# Patient Record
Sex: Male | Born: 1955 | Race: White | Hispanic: No | Smoking: Never smoker
Health system: Southern US, Community
[De-identification: ages and names within clinical notes are randomized; demographics above are authoritative.]

## PROBLEM LIST (undated history)

## (undated) DIAGNOSIS — C801 Malignant (primary) neoplasm, unspecified: Secondary | ICD-10-CM

## (undated) DIAGNOSIS — R51 Headache: Secondary | ICD-10-CM

## (undated) DIAGNOSIS — K219 Gastro-esophageal reflux disease without esophagitis: Secondary | ICD-10-CM

## (undated) DIAGNOSIS — Z9889 Other specified postprocedural states: Secondary | ICD-10-CM

## (undated) DIAGNOSIS — R112 Nausea with vomiting, unspecified: Secondary | ICD-10-CM

## (undated) DIAGNOSIS — I2510 Atherosclerotic heart disease of native coronary artery without angina pectoris: Secondary | ICD-10-CM

## (undated) DIAGNOSIS — G4733 Obstructive sleep apnea (adult) (pediatric): Secondary | ICD-10-CM

## (undated) DIAGNOSIS — R0683 Snoring: Secondary | ICD-10-CM

## (undated) DIAGNOSIS — M1990 Unspecified osteoarthritis, unspecified site: Secondary | ICD-10-CM

## (undated) HISTORY — DX: Snoring: R06.83

## (undated) HISTORY — PX: SKIN CANCER EXCISION: SHX779

## (undated) HISTORY — DX: Obstructive sleep apnea (adult) (pediatric): G47.33

## (undated) HISTORY — PX: HIP ARTHROSCOPY: SUR88

## (undated) HISTORY — PX: HERNIA REPAIR: SHX51

## (undated) HISTORY — PX: TONSILLECTOMY: SUR1361

## (undated) HISTORY — PX: EYE SURGERY: SHX253

---

## 1993-04-10 HISTORY — PX: SKIN CANCER EXCISION: SHX779

## 1997-09-03 ENCOUNTER — Ambulatory Visit (HOSPITAL_COMMUNITY): Admission: RE | Admit: 1997-09-03 | Discharge: 1997-09-03 | Payer: Self-pay | Admitting: Orthopedic Surgery

## 1998-08-03 ENCOUNTER — Ambulatory Visit (HOSPITAL_COMMUNITY): Admission: RE | Admit: 1998-08-03 | Discharge: 1998-08-03 | Payer: Self-pay | Admitting: Orthopedic Surgery

## 1998-08-03 ENCOUNTER — Encounter: Payer: Self-pay | Admitting: Orthopedic Surgery

## 1998-11-11 ENCOUNTER — Encounter: Payer: Self-pay | Admitting: Orthopedic Surgery

## 1998-11-15 ENCOUNTER — Encounter: Payer: Self-pay | Admitting: Orthopedic Surgery

## 1998-11-15 ENCOUNTER — Inpatient Hospital Stay (HOSPITAL_COMMUNITY): Admission: RE | Admit: 1998-11-15 | Discharge: 1998-11-18 | Payer: Self-pay | Admitting: Orthopedic Surgery

## 1998-12-06 ENCOUNTER — Ambulatory Visit (HOSPITAL_COMMUNITY): Admission: RE | Admit: 1998-12-06 | Discharge: 1998-12-06 | Payer: Self-pay | Admitting: Orthopedic Surgery

## 1999-04-11 HISTORY — PX: OTHER SURGICAL HISTORY: SHX169

## 1999-09-27 ENCOUNTER — Encounter: Payer: Self-pay | Admitting: Emergency Medicine

## 1999-09-27 ENCOUNTER — Emergency Department (HOSPITAL_COMMUNITY): Admission: EM | Admit: 1999-09-27 | Discharge: 1999-09-27 | Payer: Self-pay | Admitting: Emergency Medicine

## 2000-04-10 HISTORY — PX: HERNIA REPAIR: SHX51

## 2000-06-21 ENCOUNTER — Ambulatory Visit (HOSPITAL_COMMUNITY): Admission: RE | Admit: 2000-06-21 | Discharge: 2000-06-21 | Payer: Self-pay | Admitting: Orthopedic Surgery

## 2000-06-21 ENCOUNTER — Encounter: Payer: Self-pay | Admitting: Orthopedic Surgery

## 2000-07-30 ENCOUNTER — Ambulatory Visit (HOSPITAL_COMMUNITY): Admission: RE | Admit: 2000-07-30 | Discharge: 2000-07-30 | Payer: Self-pay | Admitting: Surgery

## 2004-04-26 ENCOUNTER — Ambulatory Visit (HOSPITAL_COMMUNITY): Admission: RE | Admit: 2004-04-26 | Discharge: 2004-04-26 | Payer: Self-pay | Admitting: Ophthalmology

## 2005-04-10 HISTORY — PX: RETINAL DETACHMENT SURGERY: SHX105

## 2009-12-22 ENCOUNTER — Ambulatory Visit (HOSPITAL_BASED_OUTPATIENT_CLINIC_OR_DEPARTMENT_OTHER): Admission: RE | Admit: 2009-12-22 | Discharge: 2009-12-22 | Payer: Self-pay | Admitting: Family Medicine

## 2009-12-26 ENCOUNTER — Ambulatory Visit: Payer: Self-pay | Admitting: Internal Medicine

## 2010-01-07 ENCOUNTER — Ambulatory Visit: Payer: Self-pay | Admitting: Internal Medicine

## 2010-01-07 DIAGNOSIS — J3089 Other allergic rhinitis: Secondary | ICD-10-CM

## 2010-01-07 DIAGNOSIS — J302 Other seasonal allergic rhinitis: Secondary | ICD-10-CM | POA: Insufficient documentation

## 2010-01-07 DIAGNOSIS — G4733 Obstructive sleep apnea (adult) (pediatric): Secondary | ICD-10-CM | POA: Insufficient documentation

## 2010-01-07 DIAGNOSIS — J45909 Unspecified asthma, uncomplicated: Secondary | ICD-10-CM | POA: Insufficient documentation

## 2010-02-08 ENCOUNTER — Ambulatory Visit (HOSPITAL_BASED_OUTPATIENT_CLINIC_OR_DEPARTMENT_OTHER): Admission: RE | Admit: 2010-02-08 | Discharge: 2010-02-08 | Payer: Self-pay | Admitting: Internal Medicine

## 2010-02-08 ENCOUNTER — Encounter: Payer: Self-pay | Admitting: Internal Medicine

## 2010-03-25 ENCOUNTER — Ambulatory Visit: Payer: Self-pay | Admitting: Internal Medicine

## 2010-03-31 ENCOUNTER — Encounter: Payer: Self-pay | Admitting: Internal Medicine

## 2010-04-23 ENCOUNTER — Encounter: Payer: Self-pay | Admitting: Internal Medicine

## 2010-04-26 ENCOUNTER — Ambulatory Visit
Admission: RE | Admit: 2010-04-26 | Discharge: 2010-04-26 | Payer: Self-pay | Source: Home / Self Care | Attending: Internal Medicine | Admitting: Internal Medicine

## 2010-05-03 ENCOUNTER — Encounter: Payer: Self-pay | Admitting: Internal Medicine

## 2010-05-08 ENCOUNTER — Telehealth: Payer: Self-pay | Admitting: Internal Medicine

## 2010-05-12 NOTE — Assessment & Plan Note (Signed)
Summary: James Mack   Copy to:  Dr. Bradd Canary Primary Provider/Referring Provider:  Dr. Catha Gosselin  CC:  follow up visit-review sleep study.  History of Present Illness:  History of Present Illness: January 07, 2010- 51 yoM with hx loud snoring and daytime sleepiness, referred by Dr Artis Flock. An initial sleep study was interrupted when tech woke him to adjust a wire and he couldn't regain sleep. Says his snorinig drives his girlfriend out of the house. No hx ENT surgery except tonsils. Nasal strips didn't help. He sleeps on side. Feels tired after lunch. Cutting back caffeine to 2 daily.  Bedtime 12- 1 AM, sleep latency 15 minutes, up at 6AM, not aware of waking during the night.  NPSG 12/22/09 recorded only 97 minutes of sleep and wasn't sufficient for confident interpretation, but did record some apnea events.  March 25, 2010- OSA Nurse-CC: follow up visit-review sleep study  Mild OSA AHI 6.2/ hr. Disruptive snoring and difficulty maintaining sleep have been complaints. He failed trials of chin straps, is sleeping on side and not overweight.  He is currently on cough syrup, mucinex and cefdinir for a cold.     Asthma History    Initial Asthma Severity Rating:    Age range: 12+ years    Symptoms: 0-2 days/week    Nighttime Awakenings: 0-2/month    Interferes w/ normal activity: no limitations    SABA use (not for EIB): 0-2 days/week    Asthma Severity Assessment: Intermittent   Preventive Screening-Counseling & Management  Alcohol-Tobacco     Smoking Status: never  Current Medications (verified): 1)  None  Allergies (verified): No Known Drug Allergies  Past History:  Past Medical History: Hx of ASTHMA, CHILDHOOD (ICD-493.00) ALLERGIC RHINITIS Snoring Obstructive sleep apnea with insomnia- mild- NPSG 02/08/10- AHI 6.2/hr  Review of Systems      See HPI  The patient denies shortness of breath with activity, shortness of breath at rest, productive cough,  non-productive cough, coughing up blood, chest pain, irregular heartbeats, acid heartburn, indigestion, loss of appetite, weight change, abdominal pain, difficulty swallowing, sore throat, tooth/dental problems, headaches, nasal congestion/difficulty breathing through nose, and sneezing.    Vital Signs:  Patient profile:   55 year old male Height:      73 inches Weight:      241.50 pounds BMI:     31.98 O2 Sat:      96 % on Room air Pulse rate:   75 / minute BP sitting:   124 / 80  (left arm) Cuff size:   regular  Vitals Entered By: Reynaldo Minium CMA (March 25, 2010 10:04 AM)  O2 Flow:  Room air CC: follow up visit-review sleep study   Physical Exam  Additional Exam:  General: A/Ox3; pleasant and cooperative, NAD, medium build, calm and appropriate SKIN: no rash, lesions NODES: no lymphadenopathy HEENT: /AT, EOM- WNL, Conjuctivae- clear, PERRLA, TM-WNL, Nose- clear, Throat- clear and wnl, Mallampati  II, dental repair NECK: Supple w/ fair ROM, JVD- none, normal carotid impulses w/o bruits Thyroid- normal to palpation CHEST: Clear to P&A HEART: RRR, no m/g/r heard ABDOMEN: Soft and nl; nml bowel sounds; no organomegaly or masses noted AOZ:HYQM, nl pulses, no edema  NEURO: Grossly intact to observation      Impression & Recommendations:  Problem # 1:  OBSTRUCTIVE SLEEP APNEA (ICD-327.23) We discussed treatment for sleep apnea vs simple snoing, and the medical issues, management of insomnia, and sleep hygiene. We reviewed available treatments carefully. He is  interested in trying CPAP, having failed more conservative measures.  Problem # 2:  Hx of ASTHMA, CHILDHOOD (ICD-493.00) Incidental cold now, has not triggered wheezing.  Other Orders: Est. Patient Level III (16109) DME Referral (DME)  Patient Instructions: 1)  Please schedule a follow-up appointment in 1 month. 2)  See [facility] to set up a trial of CPAP.

## 2010-05-12 NOTE — Assessment & Plan Note (Signed)
Summary: insomnia///kp   Visit Type:  Initial Consult Copy to:  Dr. Bradd Canary Primary Provider/Referring Provider:  Dr. Catha Gosselin  CC:  Sleep Consultation.  History of Present Illness: January 07, 2010- 51 yoM with hx loud snoring and daytime sleepiness, referred by Dr Artis Flock. An initial sleep study was interrupted when tech woke him to adjust a wire and he couldn't regain sleep. Says his snorinig drives his girlfriend out of the house. No hx ENT surgery except tonsils. Nasal strips didn't help. He sleeps on side. Feels tired after lunch. Cutting back caffeine to 2 daily.  Bedtime 12- 1 AM, sleep latency 15 minutes, up at 6AM, not aware of waking during the night.  NPSG 12/22/09 recorded only 97 minutes of sleep and wasn't sufficient for confident interpretation, but did record some apnea events.  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    Vital Signs:  Patient profile:   55 year old male Height:      73 inches Weight:      245 pounds BMI:     32.44 O2 Sat:      99 % on Room air Pulse rate:   59 / minute BP sitting:   126 / 74  (left arm)  Vitals Entered By: Vernie Murders (January 07, 2010 10:31 AM)  O2 Flow:  Room air  Physical Exam  Additional Exam:  General: A/Ox3; pleasant and cooperative, NAD, medium build, calm and appropriate  SKIN: no rash, lesions NODES: no lymphadenopathy HEENT: [place]/AT, EOM- WNL, Conjuctivae- clear, PERRLA, TM-WNL, Nose- clear, Throat- clear and wnl, Mallampati  II, dental repair NECK: Supple w/ fair ROM, JVD- none, normal carotid impulses w/o bruits Thyroid- normal to palpation CHEST: Clear to P&A HEART: RRR, no m/g/r heard ABDOMEN: Soft and nl; nml bowel sounds; no organomegaly or masses noted ZOX:WRUE, nl pulses, no edema  NEURO: Grossly intact to observation      Impression & Recommendations:  Problem # 1:  OBSTRUCTIVE SLEEP APNEA (ICD-327.23)  I don't think the original sleep study was representative. We will try again, giving an ambien sample. I am suspicious that he is more than just a problem snorer and probably has some degree of sleep apnea.  We have begun discussion of sleep hygiene, sleep apnea, and diagnostic  criteria.  Other Orders: Consultation Level IV (16109) Sleep Disorder Referral (Sleep Disorder)  Patient Instructions: 1)  Please schedule a follow-up appointment in 1 month. 2)  See [facility] to schedlue  Sleep study 3)  Sample Ambien 10 mg 4)  Consider  chin strap for snoring 5)  Consider a boil and bite type otc mouth piece

## 2010-05-12 NOTE — Assessment & Plan Note (Signed)
Summary: rov 1 month ///kp   Copy to:  Dr. Bradd Canary Primary Provider/Referring Provider:  Dr. Catha Gosselin  CC:  1 month follow up visit-OSA.  History of Present Illness: History of Present Illness: January 07, 2010- 51 yoM with hx loud snoring and daytime sleepiness, referred by Dr Artis Flock. An initial sleep study was interrupted when tech woke him to adjust a wire and he couldn't regain sleep. Says his snorinig drives his girlfriend out of the house. No hx ENT surgery except tonsils. Nasal strips didn't help. He sleeps on side. Feels tired after lunch. Cutting back caffeine to 2 daily.  Bedtime 12- 1 AM, sleep latency 15 minutes, up at 6AM, not aware of waking during the night.  NPSG 12/22/09 recorded only 97 minutes of sleep and wasn't sufficient for confident interpretation, but did record some apnea events.  March 25, 2010- OSA Nurse-CC: follow up visit-review sleep study  Mild OSA AHI 6.2/ hr. Disruptive snoring and difficulty maintaining sleep have been complaints. He failed trials of chin straps, is sleeping on side and not overweight.  He is currently on cough syrup, mucinex and cefdinir for a cold.  April 26, 2010-  OSA Nurse-CC: 1 month follow up visit-OSA He has not yet turned in his download chip but has continued AUTOPAP after starting last visit.  He resolved cold/ broncitis problem from last visit.  His girlfriend reports no snoring on PAP. He already feels he would miss it if sleeping without it.      Preventive Screening-Counseling & Management  Alcohol-Tobacco     Smoking Status: never  Current Medications (verified): 1)  Cpap 4-15 .... [facility]  Allergies (verified): No Known Drug Allergies  Past History:  Past Medical History: Last updated: 03/25/2010 Hx of ASTHMA, CHILDHOOD (ICD-493.00) ALLERGIC RHINITIS Snoring Obstructive sleep apnea with insomnia- mild- NPSG 02/08/10- AHI 6.2/hr  Past Surgical History: Last updated: 01/07/2010 Rt Hip replaced  2001.........................................Marland KitchenNeed to verify this???????? 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    Vital Signs:  Patient profile:   55 year old male Height:      73 inches Weight:      247.38 pounds BMI:     32.76 O2 Sat:      97 % on Room air Pulse rate:   85 / minute BP sitting:   118 / 78  (left arm) Cuff size:   regular  Vitals Entered By: Reynaldo Minium CMA (April 26, 2010 1:53 PM)  O2 Flow:  Room air CC: 1 month follow up visit-OSA   Physical Exam  Additional Exam:  General: A/Ox3; pleasant and cooperative, NAD, medium build, calm and appropriate SKIN: no rash, lesions NODES: no lymphadenopathy HEENT: [place]/AT, EOM- WNL, Conjuctivae- clear, PERRLA, TM-WNL, Nose- clear, Throat- clear and wnl, Mallampati  II, dental repair NECK: Supple w/ fair ROM, JVD- none, normal carotid impulses w/o bruits Thyroid- normal to palpation CHEST: Clear to  P&A HEART: RRR, no m/g/r heard ABDOMEN: a little overweight ZOX:WRUE, nl pulses, no edema  NEURO: Grossly intact to observation      Impression & Recommendations:  Problem # 1:  OBSTRUCTIVE SLEEP APNEA (ICD-327.23)  Good initial start on CPAP with autoPAP. He will check with DME on  turning in download,  but is comfortable pending that.  We again discussed treatment options and cpap comfort.  Problem # 2:  ALLERGY (ICD-995.3)  Anticipates some spring time nasal congestion, usually not bad enough to affect CPAP use.   Problem # 3:  Hx of ASTHMA, CHILDHOOD (ICD-493.00) No recurrrence- not an active recent issue on review with him.  Medications Added to Medication List This Visit: 1)  Cpap 4-15  .... [facility]  Other Orders: Est. Patient Level III (45409)  Patient Instructions: 1)  Please schedule a follow-up appointment in 4 months. 2)  Please check with your DME/ CPAP company about downloading your machine. When I have that report, I will ask them to make a pressure setting change. If it isn't comfortable please let me know.

## 2010-05-18 NOTE — Letter (Signed)
Summary: CMN for CPAP Supplies/[facility]  CMN for CPAP Supplies/[facility]   Imported By: Sherian Rein 05/09/2010 11:06:24  _____________________________________________________________________  External Attachment:    Type:   Image     Comment:   External Document

## 2010-05-18 NOTE — Progress Notes (Signed)
Summary: CPAP 9 based on download  Phone Note Other Incoming   Summary of Call: CPAP compliance download 04/25/10. Good compliance and control on 9 cwp. Will set fixed CPAP at 9    New/Updated Medications: * CPAP 9 ADVANCED

## 2010-08-23 ENCOUNTER — Encounter: Payer: Self-pay | Admitting: Internal Medicine

## 2010-08-25 ENCOUNTER — Ambulatory Visit (INDEPENDENT_AMBULATORY_CARE_PROVIDER_SITE_OTHER): Payer: BC Managed Care – PPO | Admitting: Internal Medicine

## 2010-08-25 ENCOUNTER — Encounter: Payer: Self-pay | Admitting: Internal Medicine

## 2010-08-25 VITALS — BP 134/90 | HR 88 | Ht 73.0 in | Wt 247.0 lb

## 2010-08-25 DIAGNOSIS — G4733 Obstructive sleep apnea (adult) (pediatric): Secondary | ICD-10-CM

## 2010-08-25 NOTE — Patient Instructions (Signed)
Continue CPAP at 9 cwp. Please call us or [facility] as needed.

## 2010-08-25 NOTE — Progress Notes (Signed)
  Subjective:    Patient ID: James Mack, male    DOB: December 11, 1955, 55 y.o.   MRN: [id number]  HPI 08/25/10- 65 yoM never smoker, followed for OSA with hx allergic rhinitis and childhood asthma.  Last here March 25, 2010. Since then he has continued to get colds from co-worker with small children.  He uses CPAP all night every night and for travel. No problems reported. Girlfriend is ok with it and will stay in the room now, which was one of his concerns. Nasal mask. [facility].   Review of Systems Constitutional:   No weight loss, night sweats,  Fevers, chills, fatigue, lassitude. HEENT:   No headaches,  Difficulty swallowing,  Tooth/dental problems,  Sore throat,                No sneezing, itching, ear ache, nasal congestion, post nasal drip,   CV:  No chest pain,  Orthopnea, PND, swelling in lower extremities, anasarca, dizziness, palpitations  GI  No heartburn, indigestion, abdominal pain, nausea, vomiting, diarrhea, change in bowel habits, loss of appetite  Resp: No shortness of breath with exertion or at rest.  No excess mucus, no productive cough,  No non-productive cough,  No coughing up of blood.  No change in color of mucus.  No wheezing.  Skin: no rash or lesions.  GU: no dysuria, change in color of urine, no urgency or frequency.  No flank pain.  MS:  No joint pain or swelling.  No decreased range of motion.  No back pain.  Psych:  No change in mood or affect. No depression or anxiety.  No memory loss.      Objective:   Physical Exam General- Alert, Oriented, Affect-appropriate, Distress- none acute  Medium build  Skin- rash-none, lesions- none, excoriation- none  Lymphadenopathy- none  Head- atraumatic  Eyes- Gross vision intact, PERRLA, conjunctivae clear, secretions  Ears- Hearing, canals, Tm - normal  Nose- Clear,  No-Septal dev, mucus, polyps, erosion, perforation   Throat- Mallampati II , mucosa clear , drainage- none, tonsils- atrophic  Neck-  flexible , trachea midline, no stridor , thyroid nl, carotid no bruit  Chest - symmetrical excursion , unlabored     Heart/CV- RRR , no murmur , no gallop  , no rub, nl s1 s2                     - JVD- none , edema- none, stasis changes- none, varices- none     Lung- clear to P&A, wheeze- none, cough- none , dullness-none, rub- none     Chest wall-   Abd- tender-no, distended-no, bowel sounds-present, HSM- no  Br/ Gen/ Rectal- Not done, not indicated  Extrem- cyanosis- none, clubbing, none, atrophy- none, strength- nl  Neuro- grossly intact to observation         Assessment & Plan:

## 2010-08-25 NOTE — Assessment & Plan Note (Signed)
Good compliance and control. No early problems identified. We discussed management.

## 2010-08-26 NOTE — Op Note (Signed)
NAME:  James Mack, James Mack NO.:  [id number]   MEDICAL RECORD NO.:  [id number]          PATIENT TYPE:  OIB   LOCATION:  2861                         FACILITY:  [facility]   PHYSICIAN:  Guadelupe Sabin, M.D.DATE OF BIRTH:  May 14, 1955   DATE OF PROCEDURE:  04/26/2004  DATE OF DISCHARGE:  04/26/2004                                 OPERATIVE REPORT   PREOPERATIVE DIAGNOSES:  Advanced vitreous fibrillar degeneration right eye,  high myopia.   POSTOPERATIVE DIAGNOSIS:  Advanced vitreous fibrillar degeneration right  eye, high myopia.   NAME OF OPERATION:  Posterior vitrectomy right eye.   SURGEON:  Dr. Stormy Fabian.   ASSISTANT:  Nurse.   ANESTHESIA:  General.   Ophthalmoscopy as previously described.   OPERATIVE PROCEDURE:  After the patient was prepped and draped, a lid  speculum was inserted in the right eye. The eye was turned downward and a  superior rectus traction suture placed. A peritomy was performed adjacent to  the limbus from the 8:00-2:30 position superiorly. The subconjunctival  tissue was cleaned and 3 sclerotomy sites repaired at the 8, 10 and 2  o'clock positions, 3.5 mm from the limbus using the MVR blade. The 4-mm  vitreous infusion terminal was secured in place at the 8 o'clock position  with a 5-0 green Mersilene suture. The tip could be seen projecting into the  vitreous cavity.   The fiberoptic light pipe was inserted at the 2 o'clock position and the  handpiece of the vitreous infusion suction cutter at the 10 o'clock  position. Slow vitreous infusion suction cutting were begun for an anterior  to posterior direction. Dense vitreous fibrillar degeneration was  encountered with marked opacity. These were gradually aspirated and removed.  The retina appeared to be intact without retinal detachment or retinal  tearing as viewed with the indirect ophthalmoscope and scleral depression.  The optic nerve blood vessels and macula appeared normal. It  was felt that  the operative goal of clearing the dense vitreous fibrillar degeneration had  been achieved.   The vitreous instruments were withdrawn and the sclerotomy sites closed with  7-0 Vicryl interrupted sutures. The conjunctiva was pulled forward and  closed with a running 7-0 Vicryl suture. Depo dexamethasone and Garamycin  were injected in the subtenon space inferiorly. Maxitrol and atropine  ointment were instilled in the conjunctival cul-de-sac. A light patch and  protector shield applied to the operated right eye. Duration of procedure 45  minutes. The patient tolerated the procedure well in general, left the  operating room for the recovery room in good condition.      HNJ/MEDQ  D:  06/11/2004  T:  06/12/2004  Job:  [id number]   cc:   Vernell Leep., M.D.  [address] [place]  [place] [address]  Fax: [phone]

## 2010-08-26 NOTE — H&P (Signed)
NAME:  James Mack, FIELDHOUSE NO.:  [id number]   MEDICAL RECORD NO.:  [id number]          PATIENT TYPE:  OIB   LOCATION:  2861                         FACILITY:  [facility]   PHYSICIAN:  Guadelupe Sabin, M.D.DATE OF BIRTH:  1955-07-05   DATE OF ADMISSION:  04/26/2004  DATE OF DISCHARGE:  04/26/2004                                HISTORY & PHYSICAL   This was a planned outpatient surgical admission of this 55 year old white  male admitted for vitrectomy surgery of the right eye.   PRESENT ILLNESS:  This patient was first seen in my office at the request of  Dr. Donalda Ewings, his regular ophthalmologist, for vitreous and retinal  evaluation.  On July 21, 2003, the patient noted that he had noted sudden  blurred vision in his right eye.  Examination revealed a corrected vision  with his highly myopic correction (-5.25) at 20/30 -2 right eye, 20/20 left  eye. The dilated detailed fundus examination showed vitreous fibrillar  degeneration with large clumps of cotton like floating opacities.  Scleral  depression failed to reveal any peripheral retinal tears or detachment.  It  was felt that the patient had vitreous fibrillar degeneration secondary to  his high myopia.  Observation was suggested.  The patient, however, returned  to the office, on April 21, 2004, stating that his vision was worse in the  involved right eye, and that he was unable to function adequately.  Examination once again revealed the large fibrillar vitreous degeneration in  the right eye.  It was, therefore, elected after a detailed discussion to  perform a vitrectomy surgery to remove the vitreous opacities and improve  his vision.  The patient, understanding the possibility of complications,  agreed and signed an informed consent.  Arrangements were made for his  outpatient admission at this time.   PAST MEDICAL HISTORY:  The patient is under the care of Dr. Clarene Duke and is in  good general health.   CURRENT MEDICATIONS:  Include glucosamine.   The patient has had a right hip replacement in the past.   He has seasonal allergies but no drug allergies.   REVIEW OF SYSTEMS:  No cardiorespiratory complaints.   PHYSICAL EXAMINATION:  VITAL SIGNS:  As recorded on admission, blood  pressure 133/80, pulse 63, respirations 18, temperature 98.0. GENERAL  APPEARANCE:  The patient is a healthy, well-nourished, well-developed, 44-  year-old, white male in no acute distress.  HEENT:  Eyes, ocular exam as noted above.  CHEST/LUNGS:  Clear to percussion and auscultation.  HEART:  Normal sinus rhythm.  No cardiomegaly.  No murmurs.  ABDOMEN:  Negative.  EXTREMITIES:  Negative.   ADMISSION DIAGNOSIS:  Vitreous degeneration, right eye.   SURGICAL PLAN:  Posterior vitrectomy, right eye.      HNJ/MEDQ  D:  06/11/2004  T:  06/11/2004  Job:  [id number]

## 2010-08-26 NOTE — Op Note (Signed)
[facility]  Patient:    James Mack, James Mack                     MRN: [id number] Proc. Date: 07/30/00 Adm. Date:  [id number] Attending:  Charlton Haws CC:         [facility]   Operative Report  ACCOUNT NUMBER:  [id number]  CCS#:  [id number]  PREOPERATIVE DIAGNOSIS:  Left inguinal hernia.  POSTOPERATIVE DIAGNOSIS:  Left inguinal hernia - indirect.  OPERATION:  Repair of left inguinal hernia with mesh.  SURGEON:  Currie Paris, M.D.  ANESTHESIA:  MAC.  CLINICAL HISTORY:  This patient is a 55 year old with a symptomatic left inguinal hernia and desired to have it repaired.  DESCRIPTION OF PROCEDURE:  The patient was seen in the holding and the operative site identified and marked.  He was then taken to the operating room.  IV sedation was administered.  The groin area was shaved, prepped and draped.  A combination of 1% Xylocaine with epinephrine plus 0.5% Marcaine was mixed equally and used for local.  I infiltrated along the incision line and then above the anterior superior iliac spine.  The incision was deepened to the external oblique aponeurosis with bleeders either electrocoagulated or tied with 4-0 Vicryl.  Additional local was infiltrated as I got through deeper layers.  The external oblique was then opened in line of its fibers.  The cord structures were dissected up off the inguinal floor and surrounded with a Penrose drain.  The ilioinguinal and iliohypogastric nerves were identified. The ilioinguinal was kept with the cord structures.  Inguinal floor was somewhat thin but intact.  An indirect sac was then identified and stripped off the cord and everted back under the deep flank.  A small mesh plug was placed into the deep ring to hold the hernia reduced and held in place, closing and tightening the internal ring with a 2-0 Prolene suture.  The patch that was associated with the plug was then overlaid  on the entire inguinal floor with the tail split to go around the cord and lay well past it.  This was sutured in place with 2-0 Prolene using a running suture on the inferior aspect and then tacking it medially to the internal oblique.  As the tails crossed, they were tacked down and attached to reconstruct a new deep ring.  There was adequate room for the cord structures to come through.  Everything appeared to be dry.  The wound was closed using a 3-0 Vicryl running on the external oblique aponeurosis, 3-0 Vicryl on the Scarpas, and 4-0 Monocryl subcuticular plus Steri-Strips.  The patient tolerated the procedure well.  There were no operative complications.  All counts were correct. DD:  07/30/00 TD:  07/28/00 Job: [id number]

## 2010-08-28 ENCOUNTER — Encounter: Payer: Self-pay | Admitting: Internal Medicine

## 2011-02-22 ENCOUNTER — Telehealth: Payer: Self-pay | Admitting: Internal Medicine

## 2011-02-22 DIAGNOSIS — G4733 Obstructive sleep apnea (adult) (pediatric): Secondary | ICD-10-CM

## 2011-02-22 NOTE — Telephone Encounter (Signed)
Pt returned call. Call office or cell # as he may be leaving office soon. James Mack

## 2011-02-22 NOTE — Telephone Encounter (Signed)
I have sent order to [facility] after to speaking to Libby-will reach patient about changing DME companies.

## 2011-02-22 NOTE — Telephone Encounter (Signed)
[id number] 

## 2011-02-23 NOTE — Telephone Encounter (Signed)
Order faxed to [facility] to supply cpap supplies Humberto Seals

## 2011-08-25 ENCOUNTER — Encounter: Payer: Self-pay | Admitting: Internal Medicine

## 2011-08-25 ENCOUNTER — Ambulatory Visit (INDEPENDENT_AMBULATORY_CARE_PROVIDER_SITE_OTHER): Payer: BC Managed Care – PPO | Admitting: Internal Medicine

## 2011-08-25 VITALS — BP 132/72 | HR 71 | Ht 73.0 in | Wt 247.4 lb

## 2011-08-25 DIAGNOSIS — J309 Allergic rhinitis, unspecified: Secondary | ICD-10-CM

## 2011-08-25 DIAGNOSIS — G4733 Obstructive sleep apnea (adult) (pediatric): Secondary | ICD-10-CM

## 2011-08-25 DIAGNOSIS — J3089 Other allergic rhinitis: Secondary | ICD-10-CM

## 2011-08-25 NOTE — Progress Notes (Signed)
  Subjective:    Patient ID: James Mack, male    DOB: 03/26/1956, 56 y.o.   MRN: [id number]  HPI 08/25/10- 56 yoM never smoker, followed for OSA with hx allergic rhinitis and childhood asthma.  Last here March 25, 2010. Since then he has continued to get colds from co-worker with small children.  He uses CPAP all night every night and for travel. No problems reported. Girlfriend is ok with it and will stay in the room now, which was one of his concerns. Nasal mask. [facility].  08/25/11- 36 yoM never smoker, followed for OSA with hx allergic rhinitis and childhood asthma. Comes with no complaints today. Describes good compliance and control with CPAP at 9 [facility]. Not been bothered much by the spring pollen. Occasional antihistamine.  ROS-see HPI Constitutional:   No-   weight loss, night sweats, fevers, chills, fatigue, lassitude. HEENT:   No-  headaches, difficulty swallowing, tooth/dental problems, sore throat,       No-  sneezing, itching, ear ache, nasal congestion, post nasal drip,  CV:  No-   chest pain, orthopnea, PND, swelling in lower extremities, anasarca, dizziness, palpitations Resp: No-   shortness of breath with exertion or at rest.              No-   productive cough,  No non-productive cough,  No- coughing up of blood.              No-   change in color of mucus.  No- wheezing.   Skin: No-   rash or lesions. GI:  No-   heartburn, indigestion, abdominal pain, nausea, vomiting, GU:  MS:  No-   joint pain or swelling.  . Neuro-     nothing unusual Psych:  No- change in mood or affect. No depression or anxiety.  No memory loss.  OBJ- Physical Exam General- Alert, Oriented, Affect-appropriate, Distress- none acute Skin- rash-none, lesions- none, excoriation- none Lymphadenopathy- none Head- atraumatic            Eyes- Gross vision intact, PERRLA, conjunctivae and secretions clear            Ears- Hearing, canals-normal            Nose- Clear, no-Septal dev,  mucus, polyps, erosion, perforation             Throat- Mallampati II-III , mucosa clear , drainage- none, tonsils- atrophic Neck- flexible , trachea midline, no stridor , thyroid nl, carotid no bruit Chest - symmetrical excursion , unlabored           Heart/CV- RRR , no murmur , no gallop  , no rub, nl s1 s2                           - JVD- none , edema- none, stasis changes- none, varices- none           Lung- clear to P&A, wheeze- none, cough- none , dullness-none, rub- none           Chest wall-  Abd- Br/ Gen/ Rectal- Not done, not indicated Extrem- cyanosis- none, clubbing, none, atrophy- none, strength- nl Neuro- grossly intact to observation

## 2011-08-25 NOTE — Patient Instructions (Signed)
Continue CPAP 9/ [facility]  Please call as needed

## 2011-08-30 NOTE — Assessment & Plan Note (Signed)
Good compliance and control with CPAP [facility]

## 2011-08-30 NOTE — Assessment & Plan Note (Signed)
Antihistamines are adequate at this time. We have discussed environmental precautions.

## 2012-10-25 ENCOUNTER — Ambulatory Visit (INDEPENDENT_AMBULATORY_CARE_PROVIDER_SITE_OTHER): Payer: BC Managed Care – PPO | Admitting: Internal Medicine

## 2012-10-25 ENCOUNTER — Encounter: Payer: Self-pay | Admitting: Internal Medicine

## 2012-10-25 VITALS — BP 118/80 | HR 59 | Ht 73.0 in | Wt 248.2 lb

## 2012-10-25 DIAGNOSIS — G4733 Obstructive sleep apnea (adult) (pediatric): Secondary | ICD-10-CM

## 2012-10-25 NOTE — Progress Notes (Signed)
Subjective:    Patient ID: MATAIO MELE, male    DOB: [birth year], 57 y.o.   MRN: [id number]  HPI 08/25/10- 54 yoM never smoker, followed for OSA with hx allergic rhinitis and childhood asthma.  Last here March 25, 2010. Since then he has continued to get colds from co-worker with small children.  He uses CPAP all night every night and for travel. No problems reported. Girlfriend is ok with it and will stay in the room now, which was one of his concerns. Nasal mask. [facility].  08/25/11- 1 yoM never smoker, followed for OSA with hx allergic rhinitis and childhood asthma. Comes with no complaints today. Describes good compliance and control with CPAP at 9 [facility]. Not been bothered much by the spring pollen. Occasional antihistamine.  10/25/12- 56 yoM never smoker, followed for OSA with hx allergic rhinitis and childhood asthma. Wearing cpap 9/ [facility] "pretty much" every night.  No problems with mask or pressure.  Will need new supplies.   Humidifier attachment really helps. Girlfriend does not report snoring. Scheduled hip replacement in October. I advised him to tell his surgeon and anesthesiologist that he uses CPAP, so it will be continued in the hospital.  ROS-see HPI Constitutional:   No-   weight loss, night sweats, fevers, chills, fatigue, lassitude. HEENT:   No-  headaches, difficulty swallowing, tooth/dental problems, sore throat,       No-  sneezing, itching, ear ache, nasal congestion, post nasal drip,  CV:  No-   chest pain, orthopnea, PND, swelling in lower extremities, anasarca, dizziness, palpitations Resp: No-   shortness of breath with exertion or at rest.              No-   productive cough,  No non-productive cough,  No- coughing up of blood.              No-   change in color of mucus.  No- wheezing.   Skin: No-   rash or lesions. GI:  No-   heartburn, indigestion, abdominal pain, nausea, vomiting, GU:  MS:  No-   joint pain or swelling.  . Neuro-      nothing unusual Psych:  No- change in mood or affect. No depression or anxiety.  No memory loss.  OBJ- Physical Exam General- Alert, Oriented, Affect-appropriate, Distress- none acute, medium build Skin- rash-none, lesions- none, excoriation- none Lymphadenopathy- none Head- atraumatic            Eyes- Gross vision intact, PERRLA, conjunctivae and secretions clear            Ears- Hearing, canals-normal            Nose- Clear, no-Septal dev, mucus, polyps, erosion, perforation             Throat- Mallampati II-III , mucosa clear , drainage- none, tonsils- atrophic Neck- flexible , trachea midline, no stridor , thyroid nl, carotid no bruit Chest - symmetrical excursion , unlabored           Heart/CV- RRR , no murmur , no gallop  , no rub, nl s1 s2                           - JVD- none , edema- none, stasis changes- none, varices- none           Lung- clear to P&A, wheeze- none, cough- none , dullness-none, rub- none  Chest wall-  Abd- Br/ Gen/ Rectal- Not done, not indicated Extrem- cyanosis- none, clubbing, none, atrophy- none, strength- nl Neuro- grossly intact to observation

## 2012-10-25 NOTE — Patient Instructions (Addendum)
Order- DME [facility]  Renewal of script for CPAP 9, humidifier, mask of choice, supplies     Dx OSA

## 2012-11-10 NOTE — Assessment & Plan Note (Signed)
He is encouraged to use his CPAP all night every night. Compliance and control are generally good.

## 2012-12-19 ENCOUNTER — Other Ambulatory Visit: Payer: Self-pay | Admitting: Orthopedic Surgery

## 2012-12-27 ENCOUNTER — Other Ambulatory Visit: Payer: Self-pay | Admitting: Orthopedic Surgery

## 2012-12-27 NOTE — Progress Notes (Signed)
Preoperative surgical orders have been place into the Epic hospital system for Sande Brothers on 12/27/2012, 1:06 PM  by Patrica Duel for surgery on 01/15/2013.  Preop Total Hip - Anterior Approach orders including Experel Injecion, PO Tylenol, and IV Decadron as long as there are no contraindications to the above medications. Avel Peace, PA-C

## 2013-01-01 ENCOUNTER — Encounter (HOSPITAL_COMMUNITY): Payer: Self-pay | Admitting: Pharmacy Technician

## 2013-01-03 ENCOUNTER — Ambulatory Visit (HOSPITAL_COMMUNITY)
Admission: RE | Admit: 2013-01-03 | Discharge: 2013-01-03 | Disposition: A | Discharge status: Home / Self Care | Payer: BC Managed Care – PPO | Source: Ambulatory Visit | Attending: Orthopedic Surgery | Admitting: Orthopedic Surgery

## 2013-01-03 ENCOUNTER — Encounter (HOSPITAL_COMMUNITY): Payer: Self-pay

## 2013-01-03 ENCOUNTER — Encounter (HOSPITAL_COMMUNITY)
Admission: RE | Admit: 2013-01-03 | Discharge: 2013-01-03 | Disposition: A | Discharge status: Home / Self Care | Payer: BC Managed Care – PPO | Source: Ambulatory Visit | Attending: Orthopedic Surgery | Admitting: Orthopedic Surgery

## 2013-01-03 DIAGNOSIS — M1610 Unilateral primary osteoarthritis, unspecified hip: Secondary | ICD-10-CM | POA: Insufficient documentation

## 2013-01-03 DIAGNOSIS — M169 Osteoarthritis of hip, unspecified: Secondary | ICD-10-CM | POA: Insufficient documentation

## 2013-01-03 DIAGNOSIS — I498 Other specified cardiac arrhythmias: Secondary | ICD-10-CM | POA: Insufficient documentation

## 2013-01-03 DIAGNOSIS — Z01818 Encounter for other preprocedural examination: Secondary | ICD-10-CM | POA: Insufficient documentation

## 2013-01-03 DIAGNOSIS — Z01810 Encounter for preprocedural cardiovascular examination: Secondary | ICD-10-CM | POA: Insufficient documentation

## 2013-01-03 DIAGNOSIS — Z01812 Encounter for preprocedural laboratory examination: Secondary | ICD-10-CM | POA: Insufficient documentation

## 2013-01-03 HISTORY — DX: Malignant (primary) neoplasm, unspecified: C80.1

## 2013-01-03 HISTORY — DX: Other specified postprocedural states: Z98.890

## 2013-01-03 HISTORY — DX: Unspecified osteoarthritis, unspecified site: M19.90

## 2013-01-03 HISTORY — DX: Headache: R51

## 2013-01-03 HISTORY — DX: Nausea with vomiting, unspecified: R11.2

## 2013-01-03 HISTORY — DX: Gastro-esophageal reflux disease without esophagitis: K21.9

## 2013-01-03 LAB — URINALYSIS, ROUTINE W REFLEX MICROSCOPIC
Bilirubin Urine: NEGATIVE
Nitrite: NEGATIVE
Protein, ur: NEGATIVE mg/dL
Specific Gravity, Urine: 1.016 (ref 1.005–1.030)
Urobilinogen, UA: 0.2 mg/dL (ref 0.0–1.0)
pH: 5.5 (ref 5.0–8.0)

## 2013-01-03 LAB — COMPREHENSIVE METABOLIC PANEL
Albumin: 3.9 g/dL (ref 3.5–5.2)
BUN: 15 mg/dL (ref 6–23)
Calcium: 9.3 mg/dL (ref 8.4–10.5)
Creatinine, Ser: 1.05 mg/dL (ref 0.50–1.35)
GFR calc Af Amer: 89 mL/min — ABNORMAL LOW (ref >90)
Sodium: 139 mEq/L (ref 135–145)
Total Bilirubin: 0.8 mg/dL (ref 0.3–1.2)
Total Protein: 6.5 g/dL (ref 6.0–8.3)

## 2013-01-03 LAB — SURGICAL PCR SCREEN: MRSA, PCR: INVALID — AB

## 2013-01-03 LAB — CBC
HCT: 40.9 % (ref 39.0–52.0)
MCH: 31.1 pg (ref 26.0–34.0)
MCHC: 35.7 g/dL (ref 30.0–36.0)
MCV: 87 fL (ref 78.0–100.0)
RDW: 12.5 % (ref 11.5–15.5)

## 2013-01-03 LAB — PROTIME-INR
INR: 1.02 (ref 0.00–1.49)
Prothrombin Time: 13.2 seconds (ref 11.6–15.2)

## 2013-01-03 LAB — APTT: aPTT: 31 seconds (ref 24–37)

## 2013-01-03 NOTE — Progress Notes (Signed)
Surgery clearance note 10/07/12 Dr. Cleda Clarks on chart

## 2013-01-03 NOTE — Patient Instructions (Addendum)
[name]  01/03/2013   Your procedure is scheduled on: 01/15/13  Report to [facility] at 6:00 AM.  Call this number if you have problems the morning of surgery [phone]   Remember:   Do not eat food or drink liquids After Midnight.     Do not wear jewelry, make-up or nail polish.  Do not wear lotions, powders, or perfumes. You may wear deodorant.  Do not shave 48 hours prior to surgery. Men may shave face and neck.  Do not bring valuables to the hospital.  Contacts, dentures or bridgework may not be worn into surgery.  Leave suitcase in the car. After surgery it may be brought to your room.  For patients admitted to the hospital, checkout time is 11:00 AM the day of discharge.   Please read over the following fact sheets that you were given: MRSA Information, blood fact sheet, incentive spirometry fact sheet Birdie Sons, RN  pre op nurse call if needed [phone]    FAILURE TO FOLLOW THESE INSTRUCTIONS MAY RESULT IN CANCELLATION OF YOUR SURGERY   Patient Signature: ___________________________________________

## 2013-01-06 LAB — MRSA CULTURE

## 2013-01-14 ENCOUNTER — Other Ambulatory Visit: Payer: Self-pay | Admitting: Orthopedic Surgery

## 2013-01-14 NOTE — H&P (Signed)
James Mack  DOB: 08/15/1955 Divorced / Language: English / Race: White Male  Date of Admission:  01/15/2013  Chief Complaint:  Left Hip Pain  History of Present Illness The patient is a 57 year old male who comes in for a preoperative History and Physical. The patient is scheduled for a left total hip arthroplasty (anterior approach) to be performed by Dr. Frank V. Aluisio, MD at [facility] on 01/15/2013. The patient is a 56 year old male who presents for follow up of their hip. The patient is being followed for their left hip pain and osteoarthritis. They are 10 month(s) (1/2) out from the last visit. Symptoms reported today include: pain, pain with weightbearing and difficulty ambulating. Note for "Follow-up Hip": He said his hip has worsened and is ready to get on the surgery schedule. James Mack comes in for evaluation for the left hip which has been causing him problems for several years now. It is progressively getting worse. He has been told in the past thatt he would require hip replacement. He had his right hip replaced by Dr. Wainer years ago but wanted to switch his care over to Dr. Aluisio. He wanted to get recommendations knowing things are a little different from years ago. The patient comes in today 14 years out from right total hip arthroplasty (done elsewhere). The patient states that he is doing okay at this time. The pain is under excellent control at this time and describes their pain as mild. The patient is currently doing home exercise program. The opposite hip is his limiting factor with his mobility. He has progressively worsening pain in that left hip. He is ready to go ahead and get it fixed. They have been treated conservatively in the past for the above stated problem and despite conservative measures, they continue to have progressive pain and severe functional limitations and dysfunction. They have failed non-operative management  including home exercise, medications. It is felt that they would benefit from undergoing total joint replacement. Risks and benefits of the procedure have been discussed with the patient and they elect to proceed with surgery. There are no active contraindications to surgery such as ongoing infection or rapidly progressive neurological disease.   Problem List Osteoarthritis, Hip (715.35) S/P hip replacement (V43.64)    Allergies No Known Drug Allergies. 11/10/2011    Family History Cancer. mother and sister Cerebrovascular Accident. mother Heart Disease. mother and father    Social History Exercise. Exercises rarely Illicit drug use. no Living situation. live alone Drug/Alcohol Rehab (Previously). no Children. 1 Current work status. working full time Drug/Alcohol Rehab (Currently). no Tobacco use. never smoker Tobacco / smoke exposure. no Marital status. divorced Number of flights of stairs before winded. greater than 5 Pain Contract. no Alcohol use. current drinker; only occasionally per week    Medication History Aspirin (81MG Tablet, Oral) Active. Allopurinol (300MG Tablet, Oral) Active.    Past Surgical History Vasectomy Inguinal Hernia Repair. Date: 2004. open: left Tonsillectomy Total Hip Replacement. Date: 2000. right Detached Retina Repair Right Eye. Date: 2007.    Medical History Skin Cancer. Nose Sleep Apnea. uses CPAP Asthma Gout Migraine Headache    Review of Systems General:Not Present- Chills, Fever, Night Sweats, Fatigue, Weight Gain, Weight Loss and Memory Loss. Skin:Not Present- Hives, Itching, Rash, Eczema and Lesions. HEENT:Not Present- Tinnitus, Headache, Double Vision, Visual Loss, Hearing Loss and Dentures. Respiratory:Not Present- Shortness of breath with exertion, Shortness of breath at rest, Allergies, Coughing up blood and   Chronic Cough. Cardiovascular:Not Present- Chest Pain,  Racing/skipping heartbeats, Difficulty Breathing Lying Down, Murmur, Swelling and Palpitations. Gastrointestinal:Not Present- Bloody Stool, Heartburn, Abdominal Pain, Vomiting, Nausea, Constipation, Diarrhea, Difficulty Swallowing, Jaundice and Loss of appetitie. Male Genitourinary:Not Present- Urinary frequency, Blood in Urine, Weak urinary stream, Discharge, Flank Pain, Incontinence, Painful Urination, Urgency, Urinary Retention and Urinating at Night. Musculoskeletal:Present- Joint Pain. Not Present- Muscle Weakness, Muscle Pain, Joint Swelling, Back Pain, Morning Stiffness and Spasms. Neurological:Not Present- Tremor, Dizziness, Blackout spells, Paralysis, Difficulty with balance and Weakness. Psychiatric:Not Present- Insomnia.    Vitals Height: 72.5 in Pulse: 58 (Regular) Resp.: 12 (Unlabored) BP: 138/82 (Sitting, Right Arm, Standard)     Physical Exam The physical exam findings are as follows:   General Mental Status - Alert, cooperative and good historian. General Appearance- pleasant. Not in acute distress. Orientation- Oriented X3. Build & Nutrition- Well nourished and Well developed.   Head and Neck Head- normocephalic, atraumatic . Neck Global Assessment- supple. no bruit auscultated on the right and no bruit auscultated on the left.   Eye Vision- Wears corrective lenses. Pupil- Bilateral- Regular and Round. Motion- Bilateral- EOMI.   Chest and Lung Exam Auscultation: Breath sounds:- clear at anterior chest wall and - clear at posterior chest wall. Adventitious sounds:- No Adventitious sounds.   Cardiovascular Auscultation:Rhythm- Regular rate and rhythm. Heart Sounds- S1 WNL and S2 WNL. Murmurs & Other Heart Sounds:Auscultation of the heart reveals - No Murmurs.   Abdomen Palpation/Percussion:Tenderness- Abdomen is non-tender to palpation. Rigidity (guarding)- Abdomen is soft. Auscultation:Auscultation of the  abdomen reveals - Bowel sounds normal.   Male Genitourinary Not done, not pertinent to present illness  Musculoskeletal On exam he is alert and oriented in no apparent distress. Evaluation of his left hip flexion 90, no internal rotation, about 20 degrees of external rotation, 20 degrees of abduction. Gait pattern is antalgic on the left.  RADIOGRAPHS: AP pelvis and lateral of the left hip show the right hip prosthesis in good position, no abnormalities. Left hip is completely bone on bone.   Assessment & Plan Osteoarthritis, Hip (715.35) Impression: Left Hip Current Plans l Pt Education - How to access health information online: discussed with patient and provided information.  Note: Plan is for a Left Total Hip Replacement - Anterior Approach by Dr. Aluisio.  Plan is to go home.  Dr. Keever - Patient has been seen preoperatively and felt to be stable for surgery.  The patient does not have any contraindications and will recieve TXA (tranexamic acid) prior to surgery.  Signed electronically by Myliah Medel L Callie Facey, III PA-C  

## 2013-01-15 ENCOUNTER — Encounter (HOSPITAL_COMMUNITY)
Admission: RE | Disposition: A | Discharge status: Home Health | Payer: Self-pay | Source: Ambulatory Visit | Attending: Orthopedic Surgery

## 2013-01-15 ENCOUNTER — Inpatient Hospital Stay (HOSPITAL_COMMUNITY): Payer: BC Managed Care – PPO | Admitting: Anesthesiology

## 2013-01-15 ENCOUNTER — Encounter (HOSPITAL_COMMUNITY): Payer: Self-pay | Admitting: *Deleted

## 2013-01-15 ENCOUNTER — Inpatient Hospital Stay (HOSPITAL_COMMUNITY): Payer: BC Managed Care – PPO

## 2013-01-15 ENCOUNTER — Encounter (HOSPITAL_COMMUNITY): Payer: BC Managed Care – PPO | Admitting: Anesthesiology

## 2013-01-15 ENCOUNTER — Inpatient Hospital Stay (HOSPITAL_COMMUNITY)
Admission: RE | Admit: 2013-01-15 | Discharge: 2013-01-17 | DRG: 818 | Disposition: A | Discharge status: Home Health | Payer: BC Managed Care – PPO | Source: Ambulatory Visit | Attending: Orthopedic Surgery | Admitting: Orthopedic Surgery

## 2013-01-15 DIAGNOSIS — Z85828 Personal history of other malignant neoplasm of skin: Secondary | ICD-10-CM

## 2013-01-15 DIAGNOSIS — Z96649 Presence of unspecified artificial hip joint: Secondary | ICD-10-CM

## 2013-01-15 DIAGNOSIS — M1610 Unilateral primary osteoarthritis, unspecified hip: Principal | ICD-10-CM | POA: Diagnosis present

## 2013-01-15 DIAGNOSIS — D62 Acute posthemorrhagic anemia: Secondary | ICD-10-CM | POA: Diagnosis not present

## 2013-01-15 DIAGNOSIS — G4733 Obstructive sleep apnea (adult) (pediatric): Secondary | ICD-10-CM | POA: Diagnosis present

## 2013-01-15 DIAGNOSIS — M169 Osteoarthritis of hip, unspecified: Secondary | ICD-10-CM | POA: Diagnosis present

## 2013-01-15 HISTORY — PX: TOTAL HIP ARTHROPLASTY: SHX124

## 2013-01-15 LAB — TYPE AND SCREEN: Antibody Screen: NEGATIVE

## 2013-01-15 LAB — ABO/RH: ABO/RH(D): O POS

## 2013-01-15 SURGERY — ARTHROPLASTY, HIP, TOTAL, ANTERIOR APPROACH
Anesthesia: Spinal | Site: Hip | Laterality: Left | Wound class: Clean

## 2013-01-15 MED ORDER — 0.9 % SODIUM CHLORIDE (POUR BTL) OPTIME
TOPICAL | Status: DC | PRN
  Administered 2013-01-15: 1000 mL

## 2013-01-15 MED ORDER — METHOCARBAMOL 500 MG PO TABS
500.0000 mg | ORAL_TABLET | Freq: Four times a day (QID) | ORAL | Status: DC | PRN
  Administered 2013-01-15 – 2013-01-16 (×5): 500 mg via ORAL
  Filled 2013-01-15 (×5): qty 1

## 2013-01-15 MED ORDER — BISACODYL 10 MG RE SUPP: 10.0000 mg | Freq: Every day | RECTAL | Status: DC | PRN

## 2013-01-15 MED ORDER — STERILE WATER FOR IRRIGATION IR SOLN
Status: DC | PRN
  Administered 2013-01-15: 1500 mL

## 2013-01-15 MED ORDER — CEFAZOLIN SODIUM-DEXTROSE 2-3 GM-% IV SOLR
2.0000 g | INTRAVENOUS | Status: AC
  Administered 2013-01-15: 2 g via INTRAVENOUS

## 2013-01-15 MED ORDER — PROMETHAZINE HCL 25 MG/ML IJ SOLN: 6.2500 mg | INTRAMUSCULAR | Status: DC | PRN

## 2013-01-15 MED ORDER — MENTHOL 3 MG MT LOZG: 1.0000 | LOZENGE | OROMUCOSAL | Status: DC | PRN

## 2013-01-15 MED ORDER — OXYCODONE HCL 5 MG PO TABS
5.0000 mg | ORAL_TABLET | ORAL | Status: DC | PRN
  Administered 2013-01-15: 23:00:00 10 mg via ORAL
  Administered 2013-01-15 (×2): 5 mg via ORAL
  Administered 2013-01-16 – 2013-01-17 (×5): 10 mg via ORAL
  Filled 2013-01-15 (×5): qty 2
  Filled 2013-01-15: qty 1
  Filled 2013-01-15 (×2): qty 2

## 2013-01-15 MED ORDER — FLEET ENEMA 7-19 GM/118ML RE ENEM: 1.0000 | ENEMA | Freq: Once | RECTAL | Status: AC | PRN

## 2013-01-15 MED ORDER — CEFAZOLIN SODIUM-DEXTROSE 2-3 GM-% IV SOLR
2.0000 g | Freq: Four times a day (QID) | INTRAVENOUS | Status: AC
  Administered 2013-01-15 (×2): 2 g via INTRAVENOUS
  Filled 2013-01-15 (×2): qty 50

## 2013-01-15 MED ORDER — PHENOL 1.4 % MT LIQD: 1.0000 | OROMUCOSAL | Status: DC | PRN

## 2013-01-15 MED ORDER — LACTATED RINGERS IV SOLN
INTRAVENOUS | Status: DC | PRN
  Administered 2013-01-15 (×4): via INTRAVENOUS

## 2013-01-15 MED ORDER — ALLOPURINOL 300 MG PO TABS
300.0000 mg | ORAL_TABLET | Freq: Every day | ORAL | Status: DC
  Administered 2013-01-15 – 2013-01-17 (×3): 300 mg via ORAL
  Filled 2013-01-15 (×3): qty 1

## 2013-01-15 MED ORDER — RIVAROXABAN 10 MG PO TABS
10.0000 mg | ORAL_TABLET | Freq: Every day | ORAL | Status: DC
  Administered 2013-01-16 – 2013-01-17 (×2): 10 mg via ORAL
  Filled 2013-01-15 (×3): qty 1

## 2013-01-15 MED ORDER — DEXAMETHASONE SODIUM PHOSPHATE 10 MG/ML IJ SOLN
10.0000 mg | Freq: Every day | INTRAMUSCULAR | Status: AC
  Filled 2013-01-15: qty 1

## 2013-01-15 MED ORDER — KCL IN DEXTROSE-NACL 20-5-0.9 MEQ/L-%-% IV SOLN
INTRAVENOUS | Status: DC
  Administered 2013-01-15: 13:00:00 100 mL/h via INTRAVENOUS
  Administered 2013-01-16: via INTRAVENOUS
  Filled 2013-01-15 (×4): qty 1000

## 2013-01-15 MED ORDER — BUPIVACAINE HCL (PF) 0.5 % IJ SOLN
INTRAMUSCULAR | Status: DC | PRN
  Administered 2013-01-15: 3 mL

## 2013-01-15 MED ORDER — TRAMADOL HCL 50 MG PO TABS: 50.0000 mg | ORAL_TABLET | Freq: Four times a day (QID) | ORAL | Status: DC | PRN

## 2013-01-15 MED ORDER — POLYETHYLENE GLYCOL 3350 17 G PO PACK: 17.0000 g | PACK | Freq: Every day | ORAL | Status: DC | PRN

## 2013-01-15 MED ORDER — BUPIVACAINE LIPOSOME 1.3 % IJ SUSP
20.0000 mL | Freq: Once | INTRAMUSCULAR | Status: DC
  Filled 2013-01-15: qty 20

## 2013-01-15 MED ORDER — BUPIVACAINE HCL (PF) 0.5 % IJ SOLN
INTRAMUSCULAR | Status: AC
  Filled 2013-01-15: qty 30

## 2013-01-15 MED ORDER — COLCHICINE 0.6 MG PO TABS
0.6000 mg | ORAL_TABLET | Freq: Every day | ORAL | Status: DC | PRN
  Filled 2013-01-15: qty 1

## 2013-01-15 MED ORDER — ONDANSETRON HCL 4 MG/2ML IJ SOLN
INTRAMUSCULAR | Status: DC | PRN
  Administered 2013-01-15: 4 mg via INTRAMUSCULAR

## 2013-01-15 MED ORDER — MIDAZOLAM HCL 5 MG/5ML IJ SOLN
INTRAMUSCULAR | Status: DC | PRN
  Administered 2013-01-15: 2 mg via INTRAVENOUS

## 2013-01-15 MED ORDER — HYDROMORPHONE HCL PF 1 MG/ML IJ SOLN: 0.2500 mg | INTRAMUSCULAR | Status: DC | PRN

## 2013-01-15 MED ORDER — SODIUM CHLORIDE 0.9 % IV SOLN: INTRAVENOUS | Status: DC

## 2013-01-15 MED ORDER — SODIUM CHLORIDE 0.9 % IJ SOLN
INTRAMUSCULAR | Status: DC | PRN
  Administered 2013-01-15: 10:00:00

## 2013-01-15 MED ORDER — DEXAMETHASONE SODIUM PHOSPHATE 10 MG/ML IJ SOLN
INTRAMUSCULAR | Status: DC | PRN
  Administered 2013-01-15: 10 mg via INTRAVENOUS

## 2013-01-15 MED ORDER — EPHEDRINE SULFATE 50 MG/ML IJ SOLN
INTRAMUSCULAR | Status: DC | PRN
  Administered 2013-01-15 (×2): 5 mg via INTRAVENOUS

## 2013-01-15 MED ORDER — METHOCARBAMOL 100 MG/ML IJ SOLN
500.0000 mg | Freq: Four times a day (QID) | INTRAVENOUS | Status: DC | PRN
  Filled 2013-01-15 (×2): qty 5

## 2013-01-15 MED ORDER — MEPERIDINE HCL 50 MG/ML IJ SOLN: 6.2500 mg | INTRAMUSCULAR | Status: DC | PRN

## 2013-01-15 MED ORDER — ONDANSETRON HCL 4 MG/2ML IJ SOLN: 4.0000 mg | Freq: Four times a day (QID) | INTRAMUSCULAR | Status: DC | PRN

## 2013-01-15 MED ORDER — METOCLOPRAMIDE HCL 10 MG PO TABS: 5.0000 mg | ORAL_TABLET | Freq: Three times a day (TID) | ORAL | Status: DC | PRN

## 2013-01-15 MED ORDER — KETOROLAC TROMETHAMINE 15 MG/ML IJ SOLN
INTRAMUSCULAR | Status: AC
  Filled 2013-01-15: qty 1

## 2013-01-15 MED ORDER — TRANEXAMIC ACID 100 MG/ML IV SOLN
1000.0000 mg | INTRAVENOUS | Status: AC
  Administered 2013-01-15: 1000 mg via INTRAVENOUS
  Filled 2013-01-15: qty 10

## 2013-01-15 MED ORDER — DEXAMETHASONE 6 MG PO TABS
10.0000 mg | ORAL_TABLET | Freq: Every day | ORAL | Status: AC
  Administered 2013-01-16: 10:00:00 10 mg via ORAL
  Filled 2013-01-15: qty 1

## 2013-01-15 MED ORDER — BUPIVACAINE HCL 0.25 % IJ SOLN
INTRAMUSCULAR | Status: AC
  Filled 2013-01-15: qty 1

## 2013-01-15 MED ORDER — SODIUM CHLORIDE 0.9 % IJ SOLN
INTRAMUSCULAR | Status: AC
  Filled 2013-01-15: qty 50

## 2013-01-15 MED ORDER — DIPHENHYDRAMINE HCL 12.5 MG/5ML PO ELIX
12.5000 mg | ORAL_SOLUTION | ORAL | Status: DC | PRN
  Administered 2013-01-17: 09:00:00 12.5 mg via ORAL
  Filled 2013-01-15: qty 10

## 2013-01-15 MED ORDER — KETAMINE HCL 10 MG/ML IJ SOLN
INTRAMUSCULAR | Status: DC | PRN
  Administered 2013-01-15: 25 mg via INTRAVENOUS
  Administered 2013-01-15: 50 mg via INTRAVENOUS

## 2013-01-15 MED ORDER — DEXAMETHASONE SODIUM PHOSPHATE 10 MG/ML IJ SOLN: 10.0000 mg | Freq: Once | INTRAMUSCULAR | Status: DC

## 2013-01-15 MED ORDER — PROPOFOL INFUSION 10 MG/ML OPTIME
INTRAVENOUS | Status: DC | PRN
  Administered 2013-01-15: 50 ug/kg/min via INTRAVENOUS

## 2013-01-15 MED ORDER — PHENYLEPHRINE HCL 10 MG/ML IJ SOLN
INTRAMUSCULAR | Status: DC | PRN
  Administered 2013-01-15: 80 ug via INTRAVENOUS
  Administered 2013-01-15: 40 ug via INTRAVENOUS
  Administered 2013-01-15: 80 ug via INTRAVENOUS
  Administered 2013-01-15: 40 ug via INTRAVENOUS

## 2013-01-15 MED ORDER — KETOROLAC TROMETHAMINE 15 MG/ML IJ SOLN
15.0000 mg | Freq: Four times a day (QID) | INTRAMUSCULAR | Status: AC | PRN
  Administered 2013-01-15: 15 mg via INTRAVENOUS

## 2013-01-15 MED ORDER — BUPIVACAINE HCL (PF) 0.25 % IJ SOLN
INTRAMUSCULAR | Status: DC | PRN
  Administered 2013-01-15: 20 mL

## 2013-01-15 MED ORDER — ACETAMINOPHEN 500 MG PO TABS
1000.0000 mg | ORAL_TABLET | Freq: Once | ORAL | Status: AC
  Administered 2013-01-15: 1000 mg via ORAL
  Filled 2013-01-15: qty 2

## 2013-01-15 MED ORDER — METOCLOPRAMIDE HCL 5 MG/ML IJ SOLN: 5.0000 mg | Freq: Three times a day (TID) | INTRAMUSCULAR | Status: DC | PRN

## 2013-01-15 MED ORDER — DOCUSATE SODIUM 100 MG PO CAPS
100.0000 mg | ORAL_CAPSULE | Freq: Two times a day (BID) | ORAL | Status: DC
  Administered 2013-01-15 – 2013-01-17 (×4): 100 mg via ORAL

## 2013-01-15 MED ORDER — LACTATED RINGERS IV SOLN: INTRAVENOUS | Status: DC

## 2013-01-15 MED ORDER — ONDANSETRON HCL 4 MG PO TABS: 4.0000 mg | ORAL_TABLET | Freq: Four times a day (QID) | ORAL | Status: DC | PRN

## 2013-01-15 MED ORDER — MORPHINE SULFATE 2 MG/ML IJ SOLN: 1.0000 mg | INTRAMUSCULAR | Status: DC | PRN

## 2013-01-15 MED ORDER — CEFAZOLIN SODIUM-DEXTROSE 2-3 GM-% IV SOLR
INTRAVENOUS | Status: AC
  Filled 2013-01-15: qty 50

## 2013-01-15 MED ORDER — PROPOFOL 10 MG/ML IV BOLUS
INTRAVENOUS | Status: DC | PRN
  Administered 2013-01-15 (×2): 30 mg via INTRAVENOUS

## 2013-01-15 MED ORDER — ACETAMINOPHEN 500 MG PO TABS
1000.0000 mg | ORAL_TABLET | Freq: Four times a day (QID) | ORAL | Status: AC
  Administered 2013-01-16 (×2): 1000 mg via ORAL
  Filled 2013-01-15 (×2): qty 2

## 2013-01-15 SURGICAL SUPPLY — 42 items
BAG SPEC THK2 15X12 ZIP CLS (MISCELLANEOUS) ×2
BAG ZIPLOCK 12X15 (MISCELLANEOUS) ×4 IMPLANT
BLADE SAW SGTL 18X1.27X75 (BLADE) ×2 IMPLANT
CAPT HIP PF COP ×2 IMPLANT
CLOSURE STERI-STRIP 1/4X4 (GAUZE/BANDAGES/DRESSINGS) ×2 IMPLANT
CLOTH BEACON ORANGE TIMEOUT ST (SAFETY) ×2 IMPLANT
DECANTER SPIKE VIAL GLASS SM (MISCELLANEOUS) ×2 IMPLANT
DRAPE C-ARM 42X120 X-RAY (DRAPES) ×2 IMPLANT
DRAPE STERI IOBAN 125X83 (DRAPES) ×2 IMPLANT
DRAPE U-SHAPE 47X51 STRL (DRAPES) ×6 IMPLANT
DRSG ADAPTIC 3X8 NADH LF (GAUZE/BANDAGES/DRESSINGS) ×2 IMPLANT
DRSG MEPILEX BORDER 4X4 (GAUZE/BANDAGES/DRESSINGS) ×2 IMPLANT
DRSG MEPILEX BORDER 4X8 (GAUZE/BANDAGES/DRESSINGS) ×2 IMPLANT
DURAPREP 26ML APPLICATOR (WOUND CARE) ×2 IMPLANT
ELECT BLADE 6.5 EXT (BLADE) ×2 IMPLANT
ELECT REM PT RETURN 9FT ADLT (ELECTROSURGICAL) ×2
ELECTRODE REM PT RTRN 9FT ADLT (ELECTROSURGICAL) ×1 IMPLANT
EVACUATOR 1/8 PVC DRAIN (DRAIN) ×2 IMPLANT
FACESHIELD LNG OPTICON STERILE (SAFETY) ×8 IMPLANT
GLOVE BIO SURGEON STRL SZ7.5 (GLOVE) ×2 IMPLANT
GLOVE BIO SURGEON STRL SZ8 (GLOVE) ×4 IMPLANT
GLOVE BIOGEL PI IND STRL 8 (GLOVE) ×2 IMPLANT
GLOVE BIOGEL PI INDICATOR 8 (GLOVE) ×2
GOWN PREVENTION PLUS LG XLONG (DISPOSABLE) ×2 IMPLANT
GOWN STRL REIN XL XLG (GOWN DISPOSABLE) ×2 IMPLANT
KIT BASIN OR (CUSTOM PROCEDURE TRAY) ×2 IMPLANT
NDL SAFETY ECLIPSE 18X1.5 (NEEDLE) ×2 IMPLANT
NEEDLE HYPO 18GX1.5 SHARP (NEEDLE) ×2
PACK TOTAL JOINT (CUSTOM PROCEDURE TRAY) ×2 IMPLANT
PADDING CAST COTTON 6X4 STRL (CAST SUPPLIES) ×2 IMPLANT
SPONGE GAUZE 4X4 12PLY (GAUZE/BANDAGES/DRESSINGS) IMPLANT
STRIP CLOSURE SKIN 1/2X4 (GAUZE/BANDAGES/DRESSINGS) ×2 IMPLANT
SUCTION FRAZIER 12FR DISP (SUCTIONS) IMPLANT
SUT ETHIBOND NAB CT1 #1 30IN (SUTURE) ×2 IMPLANT
SUT MNCRL AB 4-0 PS2 18 (SUTURE) ×2 IMPLANT
SUT VIC AB 2-0 CT1 27 (SUTURE) ×4
SUT VIC AB 2-0 CT1 TAPERPNT 27 (SUTURE) ×2 IMPLANT
SUT VLOC 180 0 24IN GS25 (SUTURE) ×2 IMPLANT
SYR 20CC LL (SYRINGE) ×2 IMPLANT
SYR 50ML LL SCALE MARK (SYRINGE) ×2 IMPLANT
TOWEL OR 17X26 10 PK STRL BLUE (TOWEL DISPOSABLE) ×2 IMPLANT
TRAY FOLEY CATH 14FRSI W/METER (CATHETERS) ×2 IMPLANT

## 2013-01-15 NOTE — Interval H&P Note (Signed)
History and Physical Interval Note:  01/15/2013 8:14 AM  James Mack  has presented today for surgery, with the diagnosis of Osteoarthitis of the Left hip  The various methods of treatment have been discussed with the patient and family. After consideration of risks, benefits and other options for treatment, the patient has consented to  Procedure(s): LEFT TOTAL HIP ARTHROPLASTY ANTERIOR APPROACH (Left) as a surgical intervention .  The patient's history has been reviewed, patient examined, no change in status, stable for surgery.  I have reviewed the patient's chart and labs.  Questions were answered to the patient's satisfaction.     Loanne Drilling

## 2013-01-15 NOTE — Anesthesia Postprocedure Evaluation (Signed)
  Anesthesia Post-op Note  Patient: James Mack  Procedure(s) Performed: Procedure(s) (LRB): LEFT TOTAL HIP ARTHROPLASTY ANTERIOR APPROACH (Left)  Patient Location: PACU  Anesthesia Type: Spinal  Level of Consciousness: awake and alert   Airway and Oxygen Therapy: Patient Spontanous Breathing  Post-op Pain: mild  Post-op Assessment: Post-op Vital signs reviewed, Patient's Cardiovascular Status Stable, Respiratory Function Stable, Patent Airway and No signs of Nausea or vomiting  Last Vitals:  Filed Vitals:   01/15/13 1445  BP: 112/70  Pulse: 66  Temp: 36.8 C  Resp: 16    Post-op Vital Signs: stable   Complications: No apparent anesthesia complications

## 2013-01-15 NOTE — Transfer of Care (Signed)
Immediate Anesthesia Transfer of Care Note  Patient: ROGER KETTLES  Procedure(s) Performed: Procedure(s): LEFT TOTAL HIP ARTHROPLASTY ANTERIOR APPROACH (Left)  Patient Location: PACU  Anesthesia Type:MAC and Spinal  Level of Consciousness: Patient easily awoken, sedated, comfortable, cooperative, following commands, responds to stimulation.   Airway & Oxygen Therapy: Patient spontaneously breathing, ventilating well, oxygen via simple oxygen mask.  Post-op Assessment: Report given to PACU RN, vital signs reviewed and stable, moving all extremities.   Post vital signs: Reviewed and stable.  Complications: No apparent anesthesia complications Immediate Anesthesia Transfer of Care Note  Patient: TEYTON PATTILLO  Procedure(s) Performed: Procedure(s): LEFT TOTAL HIP ARTHROPLASTY ANTERIOR APPROACH (Left)  Patient Location: PACU  Anesthesia Type:MAC and Spinal  Level of Consciousness: Patient easily awoken, sedated, comfortable, cooperative, following commands, responds to stimulation.   Airway & Oxygen Therapy: Patient spontaneously breathing, ventilating well, oxygen via simple oxygen mask.  Post-op Assessment: Report given to PACU RN, vital signs reviewed and stable, moving all extremities.   Post vital signs: Reviewed and stable.  Complications: No apparent anesthesia complications

## 2013-01-15 NOTE — Preoperative (Signed)
Beta Blockers   Reason not to administer Beta Blockers:Not Applicable, not on home BB 

## 2013-01-15 NOTE — Anesthesia Procedure Notes (Signed)
Spinal  Patient location during procedure: OR Staffing Anesthesiologist: Yasmin Bronaugh Performed by: anesthesiologist  Preanesthetic Checklist Completed: patient identified, site marked, surgical consent, pre-op evaluation, timeout performed, IV checked, risks and benefits discussed and monitors and equipment checked Spinal Block Patient position: sitting Prep: Betadine Patient monitoring: heart rate, continuous pulse ox and blood pressure Approach: right paramedian Location: L2-3 Injection technique: single-shot Needle Needle type: Spinocan  Needle gauge: 22 G Needle length: 9 cm Additional Notes Expiration date of kit checked and confirmed. Patient tolerated procedure well, without complications.     

## 2013-01-15 NOTE — H&P (View-Only) (Signed)
James Mack. James Mack  DOB: [birth year] Divorced / Language: [place] / Race: White Male  Date of Admission:  01/15/2013  Chief Complaint:  Left Hip Pain  History of Present Illness The patient is a 57 year old male who comes in for a preoperative History and Physical. The patient is scheduled for a left total hip arthroplasty (anterior approach) to be performed by Dr. Gus Rankin. Aluisio, MD at [facility] on 01/15/2013. The patient is a 57 year old male who presents for follow up of their hip. The patient is being followed for their left hip pain and osteoarthritis. They are 10 month(s) (1/2) out from the last visit. Symptoms reported today include: pain, pain with weightbearing and difficulty ambulating. Note for "Follow-up Hip": He said his hip has worsened and is ready to get on the surgery schedule. James Mack comes in for evaluation for the left hip which has been causing him problems for several years now. It is progressively getting worse. He has been told in the past thatt he would require hip replacement. He had his right hip replaced by Dr. Thurston Hole years ago but wanted to switch his care over to Dr. Lequita Mack. He wanted to get recommendations knowing things are a little different from years ago. The patient comes in today 14 years out from right total hip arthroplasty (done elsewhere). The patient states that he is doing okay at this time. The pain is under excellent control at this time and describes their pain as mild. The patient is currently doing home exercise program. The opposite hip is his limiting factor with his mobility. He has progressively worsening pain in that left hip. He is ready to go ahead and get it fixed. They have been treated conservatively in the past for the above stated problem and despite conservative measures, they continue to have progressive pain and severe functional limitations and dysfunction. They have failed non-operative management  including home exercise, medications. It is felt that they would benefit from undergoing total joint replacement. Risks and benefits of the procedure have been discussed with the patient and they elect to proceed with surgery. There are no active contraindications to surgery such as ongoing infection or rapidly progressive neurological disease.   Problem List Osteoarthritis, Hip (715.35) S/P hip replacement (V43.64)    Allergies No Known Drug Allergies. 11/10/2011    Family History Cancer. mother and sister Cerebrovascular Accident. mother Heart Disease. mother and father    Social History Exercise. Exercises rarely Illicit drug use. no Living situation. live alone Drug/Alcohol Rehab (Previously). no Children. 1 Current work status. working full time Drug/Alcohol Rehab (Currently). no Tobacco use. never smoker Tobacco / smoke exposure. no Marital status. divorced Number of flights of stairs before winded. greater than 5 Pain Contract. no Alcohol use. current drinker; only occasionally per week    Medication History Aspirin (81MG  Tablet, Oral) Active. Allopurinol (300MG  Tablet, Oral) Active.    Past Surgical History Vasectomy Inguinal Hernia Repair. Date: 2004. open: left Tonsillectomy Total Hip Replacement. Date: 2000. right Detached Retina Repair Right Eye. Date: 2007.    Medical History Skin Cancer. Nose Sleep Apnea. uses CPAP Asthma Gout Migraine Headache    Review of Systems General:Not Present- Chills, Fever, Night Sweats, Fatigue, Weight Gain, Weight Loss and Memory Loss. Skin:Not Present- Hives, Itching, Rash, Eczema and Lesions. HEENT:Not Present- Tinnitus, Headache, Double Vision, Visual Loss, Hearing Loss and Dentures. Respiratory:Not Present- Shortness of breath with exertion, Shortness of breath at rest, Allergies, Coughing up blood and  Chronic Cough. Cardiovascular:Not Present- Chest Pain,  Racing/skipping heartbeats, Difficulty Breathing Lying Down, Murmur, Swelling and Palpitations. Gastrointestinal:Not Present- Bloody Stool, Heartburn, Abdominal Pain, Vomiting, Nausea, Constipation, Diarrhea, Difficulty Swallowing, Jaundice and Loss of appetitie. Male Genitourinary:Not Present- Urinary frequency, Blood in Urine, Weak urinary stream, Discharge, Flank Pain, Incontinence, Painful Urination, Urgency, Urinary Retention and Urinating at Night. Musculoskeletal:Present- Joint Pain. Not Present- Muscle Weakness, Muscle Pain, Joint Swelling, Back Pain, Morning Stiffness and Spasms. Neurological:Not Present- Tremor, Dizziness, Blackout spells, Paralysis, Difficulty with balance and Weakness. Psychiatric:Not Present- Insomnia.    Vitals Height: 72.5 in Pulse: 58 (Regular) Resp.: 12 (Unlabored) BP: 138/82 (Sitting, Right Arm, Standard)     Physical Exam The physical exam findings are as follows:   General Mental Status - Alert, cooperative and good historian. General Appearance- pleasant. Not in acute distress. Orientation- Oriented X3. Build & Nutrition- Well nourished and Well developed.   Head and Neck Head- normocephalic, atraumatic . Neck Global Assessment- supple. no bruit auscultated on the right and no bruit auscultated on the left.   Eye Vision- Wears corrective lenses. Pupil- Bilateral- Regular and Round. Motion- Bilateral- EOMI.   Chest and Lung Exam Auscultation: Breath sounds:- clear at anterior chest wall and - clear at posterior chest wall. Adventitious sounds:- No Adventitious sounds.   Cardiovascular Auscultation:Rhythm- Regular rate and rhythm. Heart Sounds- S1 WNL and S2 WNL. Murmurs & Other Heart Sounds:Auscultation of the heart reveals - No Murmurs.   Abdomen Palpation/Percussion:Tenderness- Abdomen is non-tender to palpation. Rigidity (guarding)- Abdomen is soft. Auscultation:Auscultation of the  abdomen reveals - Bowel sounds normal.   Male Genitourinary Not done, not pertinent to present illness  Musculoskeletal On exam he is alert and oriented in no apparent distress. Evaluation of his left hip flexion 90, no internal rotation, about 20 degrees of external rotation, 20 degrees of abduction. Gait pattern is antalgic on the left.  RADIOGRAPHS: AP pelvis and lateral of the left hip show the right hip prosthesis in good position, no abnormalities. Left hip is completely bone on bone.   Assessment & Plan Osteoarthritis, Hip (715.35) Impression: Left Hip Current Plans l Pt Education - How to access health information online: discussed with patient and provided information.  Note: Plan is for a Left Total Hip Replacement - Anterior Approach by Dr. Lequita Mack.  Plan is to go home.  Dr. Cleda Clarks - Patient has been seen preoperatively and felt to be stable for surgery.  The patient does not have any contraindications and will recieve TXA (tranexamic acid) prior to surgery.  Signed electronically by Lauraine Rinne, III PA-C

## 2013-01-15 NOTE — Anesthesia Preprocedure Evaluation (Addendum)
Anesthesia Evaluation  Patient identified by MRN, date of birth, ID band Patient awake    Reviewed: Allergy & Precautions, H&P , NPO status , Patient's Chart, lab work & pertinent test results  History of Anesthesia Complications (+) PONV  Airway Mallampati: II TM Distance: >3 FB Neck ROM: Full    Dental no notable dental hx.    Pulmonary neg pulmonary ROS, sleep apnea ,  breath sounds clear to auscultation  Pulmonary exam normal       Cardiovascular negative cardio ROS  Rhythm:Regular Rate:Normal     Neuro/Psych  Headaches, negative neurological ROS  negative psych ROS   GI/Hepatic negative GI ROS, Neg liver ROS,   Endo/Other  negative endocrine ROS  Renal/GU negative Renal ROS  negative genitourinary   Musculoskeletal negative musculoskeletal ROS (+)   Abdominal   Peds negative pediatric ROS (+)  Hematology negative hematology ROS (+)   Anesthesia Other Findings   Reproductive/Obstetrics negative OB ROS                          Anesthesia Physical Anesthesia Plan  ASA: II  Anesthesia Plan: Spinal   Post-op Pain Management:    Induction:   Airway Management Planned: Simple Face Mask  Additional Equipment:   Intra-op Plan:   Post-operative Plan:   Informed Consent: I have reviewed the patients History and Physical, chart, labs and discussed the procedure including the risks, benefits and alternatives for the proposed anesthesia with the patient or authorized representative who has indicated his/her understanding and acceptance.   Dental advisory given  Plan Discussed with: CRNA  Anesthesia Plan Comments:         Anesthesia Quick Evaluation

## 2013-01-15 NOTE — Progress Notes (Signed)
Utilization review completed.  

## 2013-01-15 NOTE — Evaluation (Signed)
Physical Therapy Evaluation Patient Details Name: James Mack MRN: [id number] DOB: [birth year] Today's Date: 01/15/2013 Time: 8119-1478 PT Time Calculation (min): 32 min  PT Assessment / Plan / Recommendation History of Present Illness     Clinical Impression  Pt s/p L THR presents with decreased L LE strength/ROM and post op pain limiting functional mobility.  Pt should progress well to d/c home with intermittant assist of family/friends and follow up HHPT.    PT Assessment  Patient needs continued PT services    Follow Up Recommendations  Home health PT    Does the patient have the potential to tolerate intense rehabilitation      Barriers to Discharge Decreased caregiver support      Equipment Recommendations  None recommended by PT    Recommendations for Other Services OT consult   Frequency 7X/week    Precautions / Restrictions Precautions Precautions: Fall Restrictions Weight Bearing Restrictions: No Other Position/Activity Restrictions: WBAT   Pertinent Vitals/Pain 2/10, premed, ice pack provided      Mobility  Bed Mobility Bed Mobility: Supine to Sit Supine to Sit: 4: Min assist;3: Mod assist Details for Bed Mobility Assistance: cues for sequence and use of of R LE to self assist Transfers Transfers: Sit to Stand;Stand to Sit Sit to Stand: 4: Min assist Stand to Sit: 4: Min assist Details for Transfer Assistance: cues for LE management and use of UEs to self assist Ambulation/Gait Ambulation/Gait Assistance: 4: Min assist Ambulation Distance (Feet): 68 Feet Assistive device: Rolling walker Ambulation/Gait Assistance Details: cues for sequence, posture, position from RW Gait Pattern: Step-to pattern;Shuffle;Decreased step length - right;Decreased step length - left;Trunk flexed Stairs: No    Exercises Total Joint Exercises Ankle Circles/Pumps: AROM;10 reps;Supine;Both Quad Sets: AROM;10 reps;Supine;Both Heel Slides: AAROM;15  reps;Supine;Left Hip ABduction/ADduction: AAROM;Left;10 reps;Supine   PT Diagnosis: Difficulty walking  PT Problem List: Decreased strength;Decreased range of motion;Decreased activity tolerance;Decreased mobility;Decreased knowledge of use of DME;Pain PT Treatment Interventions: DME instruction;Gait training;Stair training;Functional mobility training;Therapeutic exercise;Therapeutic activities;Patient/family education     PT Goals(Current goals can be found in the care plan section) Acute Rehab PT Goals Patient Stated Goal: Resume previous lifestyle with decreased pain PT Goal Formulation: With patient Time For Goal Achievement: 01/22/13 Potential to Achieve Goals: Good  Visit Information  Last PT Received On: 01/15/13 Assistance Needed: +1       Prior Functioning  Home Living Family/patient expects to be discharged to:: Private residence Living Arrangements: Alone Available Help at Discharge: Available PRN/intermittently Type of Home: House Home Access: Stairs to enter Secretary/administrator of Steps: 4 Entrance Stairs-Rails: None Home Layout: One level Home Equipment: Environmental consultant - standard;Crutches Additional Comments: Pt reports his fiance may be able to assist dependent on family situation but he is likely to be on own a lot of the time Prior Function Level of Independence: Independent Communication Communication: No difficulties    Cognition  Cognition Arousal/Alertness: Awake/alert Behavior During Therapy: WFL for tasks assessed/performed Overall Cognitive Status: Within Functional Limits for tasks assessed    Extremity/Trunk Assessment Upper Extremity Assessment Upper Extremity Assessment: Overall WFL for tasks assessed Lower Extremity Assessment Lower Extremity Assessment: LLE deficits/detail LLE Deficits / Details: 2+/5 hip strength with AAROM to 95 flex and 20 abd   Balance    End of Session PT - End of Session Equipment Utilized During Treatment: Gait  belt Activity Tolerance: Patient tolerated treatment well Patient left: in chair;with call bell/phone within reach Nurse Communication: Mobility status  GP  James Mack 01/15/2013, 4:23 PM

## 2013-01-15 NOTE — Op Note (Signed)
OPERATIVE REPORT  PREOPERATIVE DIAGNOSIS: Osteoarthritis of the Left hip.   POSTOPERATIVE DIAGNOSIS: Osteoarthritis of the Left  hip.   PROCEDURE: Left total hip arthroplasty, anterior approach.   SURGEON: Ollen Gross, MD   ASSISTANT: Avel Peace, PA-C  ANESTHESIA:  Spinal  ESTIMATED BLOOD LOSS:- 1000 ml  DRAINS: Hemovac x1.   COMPLICATIONS: None   CONDITION: PACU - hemodynamically stable.   BRIEF CLINICAL NOTE: James Mack is a 57 y.o. male who has advanced end-  stage arthritis of his Left  hip with progressively worsening pain and  dysfunction.The patient has failed nonoperative management and presents for  total hip arthroplasty.   PROCEDURE IN DETAIL: After successful administration of spinal  anesthetic, the traction boots for the [facility] bed were placed on both  feet and the patient was placed onto the [facility] bed, boots placed into the leg  holders. The Left hip was then isolated from the perineum with plastic  drapes and prepped and draped in the usual sterile fashion. ASIS and  greater trochanter were marked and a oblique incision was made, starting  at about 1 cm lateral and 2 cm distal to the ASIS and coursing towards  the anterior cortex of the femur. The skin was cut with a 10 blade  through subcutaneous tissue to the level of the fascia overlying the  tensor fascia lata muscle. The fascia was then incised in line with the  incision at the junction of the anterior third and posterior 2/3rd. The  muscle was teased off the fascia and then the interval between the TFL  and the rectus was developed. The Hohmann retractor was then placed at  the top of the femoral neck over the capsule. The vessels overlying the  capsule were cauterized and the fat on top of the capsule was removed.  A Hohmann retractor was then placed anterior underneath the rectus  femoris to give exposure to the entire anterior capsule. A T-shaped  capsulotomy was performed. The  edges were tagged and the femoral head  was identified.       Osteophytes are removed off the superior acetabulum.  The femoral neck was then cut in situ with an oscillating saw. Traction  was then applied to the left lower extremity utilizing the [facility]  traction. The femoral head was then removed. Retractors were placed  around the acetabulum and then circumferential removal of the labrum was  performed. Osteophytes were also removed. Reaming starts at 47 mm to  medialize and  Increased in 2 mm increments to 55 mm. We reamed in  approximately 40 degrees of abduction, 20 degrees anteversion. A 56 mm  pinnacle acetabular shell was then impacted in anatomic position under  fluoroscopic guidance with excellent purchase. We did not need to place  any additional dome screws. A 36 mm neutral + 4 marathon liner was then  placed into the acetabular shell.       The femoral lift was then placed along the lateral aspect of the femur  just distal to the vastus ridge. The leg was  externally rotated and capsule  was stripped off the inferior aspect of the femoral neck down to the  level of the lesser trochanter, this was done with electrocautery. The femur was lifted after this was performed. The  leg was then placed and extended in adducted position to essentially delivering the femur. We also removed the capsule superiorly and the  piriformis from the piriformis  fossa to gain excellent exposure of the  proximal femur. Rongeur was used to remove some cancellous bone to get  into the lateral portion of the proximal femur for placement of the  initial starter reamer. The starter broaches was placed  the starter broach  and was shown to go down the center of the canal. Broaching  with the  Corail system was then performed starting at size 8, coursing  Up to size 12. A size 12 had excellent torsional and rotational  and axial stability. The trial standard offset neck was then placed  with a 36 + 8.5 trial  head. The hip was then reduced. We confirmed that  the stem was in the canal both on AP and lateral x-rays. It also has excellent sizing. The hip was reduced with outstanding stability through full extension, full external rotation,  and then flexion in adduction internal rotation. AP pelvis was taken  and the leg lengths were measured and found to be exactly equal. Hip  was then dislocated again and the femoral head and neck removed. The  femoral broach was removed. Size 12 Corail stem with a standard offset  neck was then impacted into the femur following native anteversion. Has  excellent purchase in the canal. Excellent torsional and rotational and  axial stability. It is confirmed to be in the canal on AP and lateral  fluoroscopic views. The 36 + 8.5 ceramic head was placed and the hip  reduced with outstanding stability. Again AP pelvis was taken and it  confirmed that the leg lengths were equal. The wound was then copiously  irrigated with saline solution and the capsule reattached and repaired  with Ethibond suture.  20 mL of Exparel mixed with 50 mL of saline then additional 20 ml of .25% Bupivicaine injected into the capsule and into the edge of the tensor fascia lata as well as subcutaneous tissue. The fascia overlying the tensor fascia lata was  then closed with a running #1 V-Loc. Subcu was closed with interrupted  2-0 Vicryl and subcuticular running 4-0 Monocryl. Incision was cleaned  and dried. Steri-Strips and a bulky sterile dressing applied. Hemovac  drain was hooked to suction and then he was awakened and transported to  recovery in stable condition.        Please note that a surgical assistant was a medical necessity for this procedure to perform it in a safe and expeditious manner. Assistant was necessary to provide appropriate retraction of vital neurovascular structures and to prevent femoral fracture and allow for anatomic placement of the prosthesis.  Ollen Gross, M.D.

## 2013-01-16 ENCOUNTER — Encounter (HOSPITAL_COMMUNITY): Payer: Self-pay | Admitting: Orthopedic Surgery

## 2013-01-16 DIAGNOSIS — D62 Acute posthemorrhagic anemia: Secondary | ICD-10-CM

## 2013-01-16 LAB — BASIC METABOLIC PANEL
BUN: 14 mg/dL (ref 6–23)
Calcium: 8.5 mg/dL (ref 8.4–10.5)
GFR calc Af Amer: 84 mL/min — ABNORMAL LOW (ref >90)
GFR calc non Af Amer: 73 mL/min — ABNORMAL LOW (ref >90)
Glucose, Bld: 146 mg/dL — ABNORMAL HIGH (ref 70–99)
Potassium: 4.3 mEq/L (ref 3.5–5.1)
Sodium: 139 mEq/L (ref 135–145)

## 2013-01-16 LAB — CBC
HCT: 32.9 % — ABNORMAL LOW (ref 39.0–52.0)
Hemoglobin: 11.8 g/dL — ABNORMAL LOW (ref 13.0–17.0)
MCH: 30.8 pg (ref 26.0–34.0)
MCHC: 35.9 g/dL (ref 30.0–36.0)

## 2013-01-16 MED ORDER — ALUM & MAG HYDROXIDE-SIMETH 200-200-20 MG/5ML PO SUSP
30.0000 mL | Freq: Four times a day (QID) | ORAL | Status: DC | PRN
  Administered 2013-01-16 (×2): 30 mL via ORAL
  Filled 2013-01-16 (×2): qty 30

## 2013-01-16 NOTE — Progress Notes (Signed)
Physical Therapy Treatment Patient Details Name: James Mack MRN: [id number] DOB: 1955-09-09 Today's Date: 01/16/2013 Time: 6578-4696 PT Time Calculation (min): 28 min  PT Assessment / Plan / Recommendation  History of Present Illness     PT Comments   POD # 1 pm session.  Amb pt in hallway then practiced steps using crutches.  Pt plans to D/C to home tomorrow.   Follow Up Recommendations  Home health PT     Does the patient have the potential to tolerate intense rehabilitation     Barriers to Discharge        Equipment Recommendations  None recommended by PT    Recommendations for Other Services    Frequency 7X/week   Progress towards PT Goals Progress towards PT goals: Progressing toward goals  Plan      Precautions / Restrictions Precautions Precautions: Fall Restrictions Weight Bearing Restrictions: No Other Position/Activity Restrictions: WBAT    Pertinent Vitals/Pain C/o "soreness"    Mobility  Bed Mobility Bed Mobility: Not assessed Details for Bed Mobility Assistance: pt OOB in recliner Transfers Transfers: Sit to Stand;Stand to Sit Sit to Stand: 4: Min guard;With upper extremity assist;From chair/3-in-1 Stand to Sit: 4: Min guard;To chair/3-in-1 Details for Transfer Assistance: increased time and <25% VC's on safety with turns Ambulation/Gait Ambulation/Gait Assistance: 4: Min assist;4: Min guard Ambulation Distance (Feet): 210 Feet Assistive device: Rolling walker Ambulation/Gait Assistance Details: increased time and one VC on safety with backward gait Gait Pattern: Step-to pattern;Shuffle;Decreased step length - right;Decreased step length - left;Trunk flexed Gait velocity: decreased Stairs: Yes Stairs Assistance: 4: Min guard;4: Min assist Stairs Assistance Details (indicate cue type and reason): 25% VC's on proper sequencing and safety Stair Management Technique: No rails;Forwards;With crutches     PT Goals (current goals can now be found  in the care plan section)    Visit Information  Last PT Received On: 01/16/13 Assistance Needed: +1    Subjective Data      Cognition       Balance     End of Session PT - End of Session Equipment Utilized During Treatment: Gait belt Activity Tolerance: Patient tolerated treatment well Patient left: in chair;with call bell/phone within reach   Felecia Shelling  PTA [facility]  Acute  Rehab Pager      [phone]

## 2013-01-16 NOTE — Progress Notes (Signed)
Patient has declines the use of nocturnal CPAP tonight. He states that he opted not to have his supplies brought into this facility for one night use and he does not wish to use a hospital supplied machine for tonight either. He also states that he will most likely sleep in the recliner instead of the bed for comfort reasons. He is aware that RT is available if he should change his mind and reconsider using CPAP during this admission.

## 2013-01-16 NOTE — Plan of Care (Signed)
Problem: Consults Goal: Diagnosis- Total Joint Replacement Outcome: Completed/Met Date Met:  01/16/13 Primary Total Hip Left, Anterior

## 2013-01-16 NOTE — Care Management Note (Addendum)
    Page 1 of 1   01/17/2013     7:17:30 PM   CARE MANAGEMENT NOTE 01/17/2013  Patient:  James Mack, James Mack   Account Number:  [id number]  Date Initiated:  01/16/2013  Documentation initiated by:  Colleen Can  Subjective/Objective Assessment:   DX OSTEOARTHRITIS LEFT HIP; TOTAL HIP REPLACEMNT -ANTERIOR APPROACH.     Action/Plan:   CM spoke with patient. Plans are for him to return o his home in [place] where spouse & fiancee will be caregivers. He already has standard walker, and shower seat. He is requesting Choice for dme if any other dme needed.   Anticipated DC Date:  01/16/2013   Anticipated DC Plan:  HOME W HOME HEALTH SERVICES      DC Planning Services  CM consult      [facility] Choice  HOME HEALTH   Choice offered to / List presented to:  C-1 Patient        HH arranged  HH-2 PT      [facility] agency  [facility]   Status of service:  Completed, signed off Medicare Important Message given?   (If response is "NO", the following Medicare IM given date fields will be blank) Date Medicare IM given:   Date Additional Medicare IM given:    Discharge Disposition:  HOME W HOME HEALTH SERVICES  Per UR Regulation:  Reviewed for med. necessity/level of care/duration of stay  If discussed at Long Length of Stay Meetings, dates discussed:    Comments:  01/17/2013 Colleen Can BSN RN CCM [phone] Pt discharged with [facility] services in place. Services will start within 48hrs of discharge.

## 2013-01-16 NOTE — Progress Notes (Signed)
Patient has his home CPAP mask at bedside. It is missing the end piece, which is need to connect to the tubing. He does not wish to use a hospital supplied mask at this time. He is to see if family will bring the remaining piece for his home mask. He refuses to use CPAP at this time, due to not wanting to wear a mask other than his own. He does, however, agree that if family does not bring what he needs this morning, he will utilize hospital equipment tonight. He is aware that RT is available if he should reconsider or need further assistance. RN aware.

## 2013-01-16 NOTE — Progress Notes (Signed)
Physical Therapy Treatment Patient Details Name: JARRAH BABICH MRN: [id number] DOB: 08/25/1955 Today's Date: 01/16/2013 Time: 4098-1191 PT Time Calculation (min): 24 min  PT Assessment / Plan / Recommendation  History of Present Illness     PT Comments   POD # 1 am session.  Amb pt in hallway then performed standing TE's.   Follow Up Recommendations  Home health PT     Does the patient have the potential to tolerate intense rehabilitation     Barriers to Discharge        Equipment Recommendations  None recommended by PT    Recommendations for Other Services    Frequency 7X/week   Progress towards PT Goals Progress towards PT goals: Progressing toward goals  Plan      Precautions / Restrictions Precautions Precautions: Fall Restrictions Weight Bearing Restrictions: No Other Position/Activity Restrictions: WBAT    Pertinent Vitals/Pain C/o 3/10 pain ICE applied    Mobility  Bed Mobility Bed Mobility: Not assessed Details for Bed Mobility Assistance: pt OOB in recliner Transfers Transfers: Sit to Stand;Stand to Sit Sit to Stand: 4: Min guard;With upper extremity assist;From chair/3-in-1 Stand to Sit: 4: Min guard;To chair/3-in-1 Details for Transfer Assistance: increased time and <25% VC's on safety with turns Ambulation/Gait Ambulation/Gait Assistance: 4: Min assist;4: Min guard Ambulation Distance (Feet): 155 Feet Assistive device: Rolling walker Ambulation/Gait Assistance Details: increased time and <25% VC's on safety with turns Gait Pattern: Step-to pattern;Shuffle;Decreased step length - right;Decreased step length - left;Trunk flexed Gait velocity: decreased    Exercises  standing TE's 10 reps L hip flex 10 reps L hip ABD 10 reps L hip ext 10 reps L knee flex   PT Goals (current goals can now be found in the care plan section)    Visit Information  Last PT Received On: 01/16/13 Assistance Needed: +1    Subjective Data      Cognition       Balance     End of Session PT - End of Session Equipment Utilized During Treatment: Gait belt Activity Tolerance: Patient tolerated treatment well Patient left: in chair;with call bell/phone within reach   Felecia Shelling  PTA [facility]  Acute  Rehab Pager      [phone]

## 2013-01-16 NOTE — Evaluation (Signed)
Occupational Therapy Evaluation Patient Details Name: James Mack MRN: [id number] DOB: [birth year] Today's Date: 01/16/2013 Time: 3086-5784 OT Time Calculation (min): 34 min  OT Assessment / Plan / Recommendation History of present illness     Clinical Impression   Pt was admitted for L DA THA.  Education was completed however, OT is still working with pt on toilet transfers to see if he can safely manage without DME.  Will follow in acute.      OT Assessment  Patient needs continued OT Services    Follow Up Recommendations  No OT follow up    Barriers to Discharge      Equipment Recommendations   (to be further assessed)    Recommendations for Other Services    Frequency  Min 2X/week    Precautions / Restrictions Precautions Precautions: Fall Restrictions Other Position/Activity Restrictions: WBAT   Pertinent Vitals/Pain Sore L hip; premedicated.  Repositioned    ADL  Toilet Transfer: Min guard Toilet Transfer Method: Sit to Barista: Comfort height toilet (used grab bar as vanity) Transfers/Ambulation Related to ADLs: ambulated to bathroom with min guard assist.  Practiced commode transfer, using bar with flat hand.  He wants to practice more to see if he can avoid DME ADL Comments: Pt able to reach for pants and plans barefoot or having someone get slip on slippers with back. Has precooked foot, rigged fanny pack on walker.   Reinforced not pushing through pain during activities while he is recovering     OT Diagnosis: Generalized weakness  OT Problem List: Decreased strength;Decreased knowledge of use of DME or AE;Pain OT Treatment Interventions: Self-care/ADL training;DME and/or AE instruction;Patient/family education   OT Goals(Current goals can be found in the care plan section) Acute Rehab OT Goals Patient Stated Goal: independent OT Goal Formulation: With patient Time For Goal Achievement: 01/23/13 Potential to Achieve Goals:  Good ADL Goals Pt Will Transfer to Toilet: with supervision;ambulating (comfort height with vanity)  Visit Information  Last OT Received On: 01/16/13 Assistance Needed: +1       Prior Functioning     Home Living Family/patient expects to be discharged to:: Private residence Living Arrangements: Alone Additional Comments: may have a reacher at home.  No longer has safety frame for toilet nor tub bench.  May have reacher.  Finance may or may not be with him during the weekend.  His mother recently died and father can come over and help prn.  Pt is very independent Prior Function Level of Independence: Independent Communication Communication: No difficulties         Vision/Perception     Cognition  Cognition Behavior During Therapy: WFL for tasks assessed/performed Overall Cognitive Status: Within Functional Limits for tasks assessed    Extremity/Trunk Assessment Upper Extremity Assessment Upper Extremity Assessment: Overall WFL for tasks assessed     Mobility Bed Mobility Supine to Sit: 5: Supervision;HOB elevated (20 degrees) Transfers Sit to Stand: 4: Min guard;From bed;From toilet;With upper extremity assist     Exercise     Balance     End of Session OT - End of Session Activity Tolerance: Patient tolerated treatment well Patient left: in chair;with call bell/phone within reach  GO     [facility] 01/16/2013, 9:01 AM Marica Otter, OTR/L [phone] 01/16/2013

## 2013-01-16 NOTE — Progress Notes (Signed)
   Subjective: 1 Day Post-Op Procedure(s) (LRB): LEFT TOTAL HIP ARTHROPLASTY ANTERIOR APPROACH (Left) Patient reports pain as mild.   Patient seen in rounds by Dr. Lequita Halt. Patient is well, and has had no acute complaints or problems We will start therapy today.  Plan is to go Home after hospital stay.  Objective: Vital signs in last 24 hours: Temp:  [97.4 F (36.3 C)-98.4 F (36.9 C)] 98.2 F (36.8 C) (10/09 0615) Pulse Rate:  [50-86] 50 (10/09 0615) Resp:  [12-24] 16 (10/09 0615) BP: (106-119)/(65-80) 117/72 mmHg (10/09 0615) SpO2:  [98 %-100 %] 100 % (10/09 0615) Weight:  [106.142 kg (234 lb)] 106.142 kg (234 lb) (10/08 1146)  Intake/Output from previous day:  Intake/Output Summary (Last 24 hours) at 01/16/13 0814 Last data filed at 01/16/13 0737  Gross per 24 hour  Intake 7418.33 ml  Output   6475 ml  Net 943.33 ml    Intake/Output this shift: Total I/O In: 1028.3 [P.O.:480; I.V.:548.3] Out: -   Labs:  Recent Labs  01/16/13 0503  HGB 11.8*    Recent Labs  01/16/13 0503  WBC 11.4*  RBC 3.83*  HCT 32.9*  PLT 193    Recent Labs  01/16/13 0503  NA 139  K 4.3  CL 104  CO2 28  BUN 14  CREATININE 1.10  GLUCOSE 146*  CALCIUM 8.5   No results found for this basename: LABPT, INR,  in the last 72 hours  EXAM General - Patient is Alert, Appropriate and Oriented Extremity - Neurovascular intact Sensation intact distally Dorsiflexion/Plantar flexion intact Dressing - dressing C/D/I Motor Function - intact, moving foot and toes well on exam.  Hemovac pulled without difficulty.  Past Medical History  Diagnosis Date  . Asthma     childhood  . Allergic rhinitis   . Snoring   . OSA (obstructive sleep apnea)     w/ insomnia NPSG 02/08/10-AHI 6.2 hr  . GERD (gastroesophageal reflux disease)     occasional  . Headache(784.0)     ocular migraines  . Arthritis     hips, fingers  . Cancer     skin cancer  . PONV (postoperative nausea and vomiting)      Assessment/Plan: 1 Day Post-Op Procedure(s) (LRB): LEFT TOTAL HIP ARTHROPLASTY ANTERIOR APPROACH (Left) Principal Problem:   OA (osteoarthritis) of hip Active Problems:   Postoperative anemia due to acute blood loss  Estimated body mass index is 30.88 kg/(m^2) as calculated from the following:   Height as of this encounter: 6\' 1"  (1.854 m).   Weight as of this encounter: 106.142 kg (234 lb). Advance diet Up with therapy Discharge home with home health  DVT Prophylaxis - Xarelto Weight Bearing As Tolerated left Leg Hemovac Pulled Begin Therapy  James Mack 01/16/2013, 8:14 AM

## 2013-01-17 LAB — BASIC METABOLIC PANEL
Calcium: 8.7 mg/dL (ref 8.4–10.5)
GFR calc Af Amer: >90 mL/min (ref >90)
GFR calc non Af Amer: >90 mL/min (ref >90)
Glucose, Bld: 136 mg/dL — ABNORMAL HIGH (ref 70–99)
Potassium: 4.2 mEq/L (ref 3.5–5.1)
Sodium: 139 mEq/L (ref 135–145)

## 2013-01-17 LAB — CBC
MCH: 30.3 pg (ref 26.0–34.0)
MCHC: 34.4 g/dL (ref 30.0–36.0)
Platelets: 188 10*3/uL (ref 150–400)
RBC: 3.7 MIL/uL — ABNORMAL LOW (ref 4.22–5.81)
RDW: 12.8 % (ref 11.5–15.5)

## 2013-01-17 MED ORDER — RIVAROXABAN 10 MG PO TABS: 10.0000 mg | ORAL_TABLET | Freq: Every day | ORAL | Status: DC

## 2013-01-17 MED ORDER — METHOCARBAMOL 500 MG PO TABS: 500.0000 mg | ORAL_TABLET | Freq: Four times a day (QID) | ORAL | Status: DC | PRN

## 2013-01-17 MED ORDER — OXYCODONE HCL 5 MG PO TABS: 5.0000 mg | ORAL_TABLET | ORAL | Status: DC | PRN

## 2013-01-17 MED ORDER — TRAMADOL HCL 50 MG PO TABS: 50.0000 mg | ORAL_TABLET | Freq: Four times a day (QID) | ORAL | Status: DC | PRN

## 2013-01-17 NOTE — Discharge Summary (Signed)
Physician Discharge Summary   Patient ID: James Mack MRN: [id number] DOB/AGE: [birth year] 57 y.o.  Admit date: 01/15/2013 Discharge date: 01/17/2013  Primary Diagnosis:  Osteoarthritis of the Left hip.   Admission Diagnoses:  Past Medical History  Diagnosis Date  . Asthma     childhood  . Allergic rhinitis   . Snoring   . OSA (obstructive sleep apnea)     w/ insomnia NPSG 02/08/10-AHI 6.2 hr  . GERD (gastroesophageal reflux disease)     occasional  . Headache(784.0)     ocular migraines  . Arthritis     hips, fingers  . Cancer     skin cancer  . PONV (postoperative nausea and vomiting)    Discharge Diagnoses:   Principal Problem:   OA (osteoarthritis) of hip Active Problems:   Postoperative anemia due to acute blood loss  Estimated body mass index is 30.88 kg/(m^2) as calculated from the following:   Height as of this encounter: 6\' 1"  (1.854 m).   Weight as of this encounter: 106.142 kg (234 lb).  Procedure(s) (LRB): LEFT TOTAL HIP ARTHROPLASTY ANTERIOR APPROACH (Left)   Consults: None  HPI: James Mack is a 57 y.o. male who has advanced end-  stage arthritis of his Left hip with progressively worsening pain and  dysfunction.The patient has failed nonoperative management and presents for  total hip arthroplasty.   Laboratory Data: Admission on 01/15/2013, Discharged on 01/17/2013  Component Date Value Range Status  . ABO/RH(D) 01/15/2013 O POS   Final  . Antibody Screen 01/15/2013 NEG   Final  . Sample Expiration 01/15/2013 01/18/2013   Final  . ABO/RH(D) 01/15/2013 O POS   Final  . WBC 01/16/2013 11.4* 4.0 - 10.5 K/uL Final  . RBC 01/16/2013 3.83* 4.22 - 5.81 MIL/uL Final  . Hemoglobin 01/16/2013 11.8* 13.0 - 17.0 g/dL Final  . HCT 40/98/1191 32.9* 39.0 - 52.0 % Final  . MCV 01/16/2013 85.9  78.0 - 100.0 fL Final  . MCH 01/16/2013 30.8  26.0 - 34.0 pg Final  . MCHC 01/16/2013 35.9  30.0 - 36.0 g/dL Final  . RDW 47/82/9562 12.5  11.5 - 15.5 %  Final  . Platelets 01/16/2013 193  150 - 400 K/uL Final  . Sodium 01/16/2013 139  135 - 145 mEq/L Final  . Potassium 01/16/2013 4.3  3.5 - 5.1 mEq/L Final  . Chloride 01/16/2013 104  96 - 112 mEq/L Final  . CO2 01/16/2013 28  19 - 32 mEq/L Final  . Glucose, Bld 01/16/2013 146* 70 - 99 mg/dL Final  . BUN 13/11/6576 14  6 - 23 mg/dL Final  . Creatinine, Ser 01/16/2013 1.10  0.50 - 1.35 mg/dL Final  . Calcium 46/96/2952 8.5  8.4 - 10.5 mg/dL Final  . GFR calc non Af Amer 01/16/2013 73* >90 mL/min Final  . GFR calc Af Amer 01/16/2013 84* >90 mL/min Final   Comment: (NOTE)                          The eGFR has been calculated using the CKD EPI equation.                          This calculation has not been validated in all clinical situations.                          eGFR's persistently <90 mL/min signify  possible Chronic Kidney                          Disease.  . WBC 01/17/2013 10.0  4.0 - 10.5 K/uL Final  . RBC 01/17/2013 3.70* 4.22 - 5.81 MIL/uL Final  . Hemoglobin 01/17/2013 11.2* 13.0 - 17.0 g/dL Final  . HCT 13/11/6576 32.6* 39.0 - 52.0 % Final  . MCV 01/17/2013 88.1  78.0 - 100.0 fL Final  . MCH 01/17/2013 30.3  26.0 - 34.0 pg Final  . MCHC 01/17/2013 34.4  30.0 - 36.0 g/dL Final  . RDW 46/96/2952 12.8  11.5 - 15.5 % Final  . Platelets 01/17/2013 188  150 - 400 K/uL Final  . Sodium 01/17/2013 139  135 - 145 mEq/L Final  . Potassium 01/17/2013 4.2  3.5 - 5.1 mEq/L Final  . Chloride 01/17/2013 104  96 - 112 mEq/L Final  . CO2 01/17/2013 30  19 - 32 mEq/L Final  . Glucose, Bld 01/17/2013 136* 70 - 99 mg/dL Final  . BUN 84/13/2440 13  6 - 23 mg/dL Final  . Creatinine, Ser 01/17/2013 0.91  0.50 - 1.35 mg/dL Final  . Calcium 02/04/2535 8.7  8.4 - 10.5 mg/dL Final  . GFR calc non Af Amer 01/17/2013 >90  >90 mL/min Final  . GFR calc Af Amer 01/17/2013 >90  >90 mL/min Final   Comment: (NOTE)                          The eGFR has been calculated using the CKD EPI equation.                           This calculation has not been validated in all clinical situations.                          eGFR's persistently <90 mL/min signify possible Chronic Kidney                          Disease.  Hospital Outpatient Visit on 01/03/2013  Component Date Value Range Status  . Specimen Description 01/03/2013 NOSE   Final  . Special Requests 01/03/2013 NONE   Final  . Culture 01/03/2013    Final                   Value:NO STAPHYLOCOCCUS AUREUS ISOLATED                         Note: No MRSA Isolated                         Performed at [facility]  . Report Status 01/03/2013 01/06/2013 FINAL   Final  Hospital Outpatient Visit on 01/03/2013  Component Date Value Range Status  . MRSA, PCR 01/03/2013 INVALID RESULTS, SPECIMEN SENT FOR CULTURE* NEGATIVE Final   AFTER HOURS 1844 01/03/2013 A NAVARRO  . Staphylococcus aureus 01/03/2013 INVALID RESULTS, SPECIMEN SENT FOR CULTURE* NEGATIVE Final   Comment:                                 The Xpert SA Assay (FDA  approved for NASAL specimens                          in patients over 79 years of age),                          is one component of                          a comprehensive surveillance                          program.  Test performance has                          been validated by [employer] for patients greater                          than or equal to 8 year old.                          It is not intended                          to diagnose infection nor to                          guide or monitor treatment.  Marland Kitchen aPTT 01/03/2013 31  24 - 37 seconds Final  . WBC 01/03/2013 4.8  4.0 - 10.5 K/uL Final  . RBC 01/03/2013 4.70  4.22 - 5.81 MIL/uL Final  . Hemoglobin 01/03/2013 14.6  13.0 - 17.0 g/dL Final  . HCT 96/07/5407 40.9  39.0 - 52.0 % Final  . MCV 01/03/2013 87.0  78.0 - 100.0 fL Final  . MCH 01/03/2013 31.1  26.0 - 34.0 pg Final  . MCHC 01/03/2013 35.7   30.0 - 36.0 g/dL Final  . RDW 81/19/1478 12.5  11.5 - 15.5 % Final  . Platelets 01/03/2013 183  150 - 400 K/uL Final  . Sodium 01/03/2013 139  135 - 145 mEq/L Final  . Potassium 01/03/2013 3.7  3.5 - 5.1 mEq/L Final  . Chloride 01/03/2013 101  96 - 112 mEq/L Final  . CO2 01/03/2013 29  19 - 32 mEq/L Final  . Glucose, Bld 01/03/2013 89  70 - 99 mg/dL Final  . BUN 29/56/2130 15  6 - 23 mg/dL Final  . Creatinine, Ser 01/03/2013 1.05  0.50 - 1.35 mg/dL Final  . Calcium 86/57/8469 9.3  8.4 - 10.5 mg/dL Final  . Total Protein 01/03/2013 6.5  6.0 - 8.3 g/dL Final  . Albumin 62/95/2841 3.9  3.5 - 5.2 g/dL Final  . AST 32/44/0102 28  0 - 37 U/L Final  . ALT 01/03/2013 30  0 - 53 U/L Final  . Alkaline Phosphatase 01/03/2013 67  39 - 117 U/L Final  . Total Bilirubin 01/03/2013 0.8  0.3 - 1.2 mg/dL Final  . GFR calc non Af Amer 01/03/2013 77* >90 mL/min Final  . GFR calc Af Amer 01/03/2013 89* >90 mL/min Final   Comment: (NOTE)  The eGFR has been calculated using the CKD EPI equation.                          This calculation has not been validated in all clinical situations.                          eGFR's persistently <90 mL/min signify possible Chronic Kidney                          Disease.  Marland Kitchen Prothrombin Time 01/03/2013 13.2  11.6 - 15.2 seconds Final  . INR 01/03/2013 1.02  0.00 - 1.49 Final  . Color, Urine 01/03/2013 YELLOW  YELLOW Final  . APPearance 01/03/2013 CLEAR  CLEAR Final  . Specific Gravity, Urine 01/03/2013 1.016  1.005 - 1.030 Final  . pH 01/03/2013 5.5  5.0 - 8.0 Final  . Glucose, UA 01/03/2013 NEGATIVE  NEGATIVE mg/dL Final  . Hgb urine dipstick 01/03/2013 NEGATIVE  NEGATIVE Final  . Bilirubin Urine 01/03/2013 NEGATIVE  NEGATIVE Final  . Ketones, ur 01/03/2013 NEGATIVE  NEGATIVE mg/dL Final  . Protein, ur 16/01/9603 NEGATIVE  NEGATIVE mg/dL Final  . Urobilinogen, UA 01/03/2013 0.2  0.0 - 1.0 mg/dL Final  . Nitrite 54/12/8117 NEGATIVE  NEGATIVE  Final  . Leukocytes, UA 01/03/2013 NEGATIVE  NEGATIVE Final   MICROSCOPIC NOT DONE ON URINES WITH NEGATIVE PROTEIN, BLOOD, LEUKOCYTES, NITRITE, OR GLUCOSE <1000 mg/dL.     X-Rays:Dg Chest 2 View  01/03/2013   CLINICAL DATA:  Preop for left hip replacement  EXAM: CHEST  2 VIEW  COMPARISON:  04/22/2004  FINDINGS: The heart size and mediastinal contours are within normal limits. No acute infiltrate or pleural effusion. No pulmonary edema. The visualized skeletal structures are unremarkable.  IMPRESSION: No active cardiopulmonary disease.   Electronically Signed   By: Natasha Mead   On: 01/03/2013 15:11   Dg Hip Complete Left  01/03/2013   *RADIOLOGY REPORT*  Clinical Data: Preop left total hip replacement.  LEFT HIP - COMPLETE 2+ VIEW  Comparison: None.  Findings: There is superior and medial joint space loss in the left hip with associated subchondral sclerosis and osteophytosis.  Prior right hip arthroplasty.  IMPRESSION: Severe left hip osteoarthritis.   Original Report Authenticated By: Leanna Battles, M.D.   Dg Pelvis Portable  01/15/2013   CLINICAL DATA:  Postop left hip arthroplasty.  EXAM: PORTABLE PELVIS  COMPARISON:  01/03/2013.  FINDINGS: Interval left hip arthroplasty with subcutaneous and joint air noted. Surgical drain is in place. Previous right hip arthroplasty.  IMPRESSION: Interval left hip arthroplasty with expected postoperative findings.   Electronically Signed   By: Leanna Battles M.D.   On: 01/15/2013 11:23   Dg C-arm 61-120 Min-no Report  01/15/2013   CLINICAL DATA: left hip replacement   C-ARM 61-120 MINUTES  Fluoroscopy was utilized by the requesting physician.  No radiographic  interpretation.     EKG: Orders placed during the hospital encounter of 01/03/13  . EKG     Hospital Course: Patient was admitted to [facility] and taken to the OR and underwent the above state procedure without complications.  Patient tolerated the procedure well and was later  transferred to the recovery room and then to the orthopaedic floor for postoperative care.  They were given PO and IV analgesics for pain control following their surgery.  They were given 24 hours  of postoperative antibiotics of  Anti-infectives   Start     Dose/Rate Route Frequency Ordered Stop   01/15/13 1400  ceFAZolin (ANCEF) IVPB 2 g/50 mL premix     2 g 100 mL/hr over 30 Minutes Intravenous Every 6 hours 01/15/13 1152 01/15/13 2022   01/15/13 0630  ceFAZolin (ANCEF) IVPB 2 g/50 mL premix     2 g 100 mL/hr over 30 Minutes Intravenous On call to O.R. 01/15/13 1610 01/15/13 0814     and started on DVT prophylaxis in the form of Xarelto.   PT and OT were ordered for total hip protocol. The patient was allowed to be WBAT with therapy. Discharge planning was consulted to help with postop disposition and equipment needs.  Patient had a good night on the evening of surgery.  They started to get up OOB with therapy on day one.  Hemovac drain was pulled without difficulty.  Continued to work with therapy into day two.  Dressing was changed on day two and the incision was healing well.  Patient was seen in rounds and was ready to go home.   Discharge Medications: Prior to Admission medications   Medication Sig Start Date End Date Taking? Authorizing Provider  allopurinol (ZYLOPRIM) 300 MG tablet Take 300 mg by mouth daily.  06/24/11  Yes Historical Provider, MD  colchicine 0.6 MG tablet Take 0.6 mg by mouth daily as needed (for gout).    Historical Provider, MD  methocarbamol (ROBAXIN) 500 MG tablet Take 1 tablet (500 mg total) by mouth every 6 (six) hours as needed. 01/17/13   Alexzandrew Perkins, PA-C  oxyCODONE (OXY IR/ROXICODONE) 5 MG immediate release tablet Take 1-2 tablets (5-10 mg total) by mouth every 3 (three) hours as needed. 01/17/13   Alexzandrew Julien Girt, PA-C  rivaroxaban (XARELTO) 10 MG TABS tablet Take 1 tablet (10 mg total) by mouth daily with breakfast. Take Xarelto for two and a half  more weeks, then discontinue Xarelto. Once the patient has completed the Xarelto, they may resume the 81 mg Aspirin. 01/17/13   Alexzandrew Perkins, PA-C  traMADol (ULTRAM) 50 MG tablet Take 1-2 tablets (50-100 mg total) by mouth every 6 (six) hours as needed (mild pain). 01/17/13   Alexzandrew Julien Girt, PA-C   Discharge home with home health  Diet - Regular diet  Follow up - in 2 weeks  Activity - WBAT  Disposition - Home  Condition Upon Discharge - Good  D/C Meds - See DC Summary  DVT Prophylaxis - Xarelto      Discharge Orders   Future Appointments Provider Department Dept Phone   10/30/2013 2:30 PM Waymon Budge, MD [facility] [phone]   Future Orders Complete By Expires   Call MD / Call 911  As directed    Comments:     If you experience chest pain or shortness of breath, CALL 911 and be transported to the hospital emergency room.  If you develope a fever above 101 F, pus (white drainage) or increased drainage or redness at the wound, or calf pain, call your surgeon's office.   Change dressing  As directed    Comments:     You may change your dressing dressing daily with sterile 4 x 4 inch gauze dressing and paper tape.  Do not submerge the incision under water.   Constipation Prevention  As directed    Comments:     Drink plenty of fluids.  Prune juice may be helpful.  You may use a stool  softener, such as Colace (over the counter) 100 mg twice a day.  Use MiraLax (over the counter) for constipation as needed.   Diet - low sodium heart healthy  As directed    Discharge instructions  As directed    Comments:     Pick up stool softner and laxative for home. Do not submerge incision under water. May shower. Continue to use ice for pain and swelling from surgery.  Total Hip Protocol.  Take Xarelto for two and a half more weeks, then discontinue Xarelto. Once the patient has completed the Xarelto, they may resume the 81 mg Aspirin.   Do not sit on low chairs,  stoools or toilet seats, as it may be difficult to get up from low surfaces  As directed    Driving restrictions  As directed    Comments:     No driving until released by the physician.   Increase activity slowly as tolerated  As directed    Lifting restrictions  As directed    Comments:     No lifting until released by the physician.   Patient may shower  As directed    Comments:     You may shower without a dressing once there is no drainage.  Do not wash over the wound.  If drainage remains, do not shower until drainage stops.   TED hose  As directed    Comments:     Use stockings (TED hose) for 3 weeks on both leg(s).  You may remove them at night for sleeping.   Weight bearing as tolerated  As directed    Questions:     Laterality:     Extremity:         Medication List    STOP taking these medications       aspirin 81 MG tablet      TAKE these medications       allopurinol 300 MG tablet  Commonly known as:  ZYLOPRIM  Take 300 mg by mouth daily.     colchicine 0.6 MG tablet  Take 0.6 mg by mouth daily as needed (for gout).     methocarbamol 500 MG tablet  Commonly known as:  ROBAXIN  Take 1 tablet (500 mg total) by mouth every 6 (six) hours as needed.     oxyCODONE 5 MG immediate release tablet  Commonly known as:  Oxy IR/ROXICODONE  Take 1-2 tablets (5-10 mg total) by mouth every 3 (three) hours as needed.     rivaroxaban 10 MG Tabs tablet  Commonly known as:  XARELTO  - Take 1 tablet (10 mg total) by mouth daily with breakfast. Take Xarelto for two and a half more weeks, then discontinue Xarelto.  - Once the patient has completed the Xarelto, they may resume the 81 mg Aspirin.     traMADol 50 MG tablet  Commonly known as:  ULTRAM  Take 1-2 tablets (50-100 mg total) by mouth every 6 (six) hours as needed (mild pain).       Follow-up Information   Follow up with Loanne Drilling, MD. Schedule an appointment as soon as possible for a visit in 2 weeks.    Specialty:  Orthopedic Surgery   Contact information:   [address] [place] [address] [phone]       Signed: Patrica Duel 02/03/2013, 9:01 PM

## 2013-01-17 NOTE — Progress Notes (Signed)
OT Cancellation Note  Patient Details Name: James Mack MRN: [id number] DOB: 1955-07-04   Cancelled Treatment:     Checked on pt. He does not want DME for toilet and feels comfortable with transfers/adls.  Will sign off.    Suede Greenawalt 01/17/2013, 7:32 AM Marica Otter, OTR/L [phone] 01/17/2013

## 2013-01-17 NOTE — Progress Notes (Signed)
   Subjective: 2 Days Post-Op Procedure(s) (LRB): LEFT TOTAL HIP ARTHROPLASTY ANTERIOR APPROACH (Left) Patient reports pain as mild.   Patient seen in rounds with Dr. Lequita Halt. Patient is well, and has had no acute complaints or problems Patient is ready to go home  Objective: Vital signs in last 24 hours: Temp:  [97.3 F (36.3 C)-98.2 F (36.8 C)] 97.3 F (36.3 C) (10/10 0520) Pulse Rate:  [47-68] 50 (10/10 0520) Resp:  [14-16] 16 (10/10 0520) BP: (106-118)/(67-68) 110/68 mmHg (10/10 0520) SpO2:  [96 %-100 %] 100 % (10/10 0520)  Intake/Output from previous day:  Intake/Output Summary (Last 24 hours) at 01/17/13 0919 Last data filed at 01/16/13 1800  Gross per 24 hour  Intake    720 ml  Output      0 ml  Net    720 ml    Intake/Output this shift:    Labs:  Recent Labs  01/16/13 0503 01/17/13 0432  HGB 11.8* 11.2*    Recent Labs  01/16/13 0503 01/17/13 0432  WBC 11.4* 10.0  RBC 3.83* 3.70*  HCT 32.9* 32.6*  PLT 193 188    Recent Labs  01/16/13 0503 01/17/13 0432  NA 139 139  K 4.3 4.2  CL 104 104  CO2 28 30  BUN 14 13  CREATININE 1.10 0.91  GLUCOSE 146* 136*  CALCIUM 8.5 8.7   No results found for this basename: LABPT, INR,  in the last 72 hours  EXAM: General - Patient is Alert, Appropriate and Oriented Extremity - Neurovascular intact Sensation intact distally Dorsiflexion/Plantar flexion intact Incision - clean, dry, no drainage, healing Motor Function - intact, moving foot and toes well on exam.   Assessment/Plan: 2 Days Post-Op Procedure(s) (LRB): LEFT TOTAL HIP ARTHROPLASTY ANTERIOR APPROACH (Left) Procedure(s) (LRB): LEFT TOTAL HIP ARTHROPLASTY ANTERIOR APPROACH (Left) Past Medical History  Diagnosis Date  . Asthma     childhood  . Allergic rhinitis   . Snoring   . OSA (obstructive sleep apnea)     w/ insomnia NPSG 02/08/10-AHI 6.2 hr  . GERD (gastroesophageal reflux disease)     occasional  . Headache(784.0)     ocular  migraines  . Arthritis     hips, fingers  . Cancer     skin cancer  . PONV (postoperative nausea and vomiting)    Principal Problem:   OA (osteoarthritis) of hip Active Problems:   Postoperative anemia due to acute blood loss  Estimated body mass index is 30.88 kg/(m^2) as calculated from the following:   Height as of this encounter: 6\' 1"  (1.854 m).   Weight as of this encounter: 106.142 kg (234 lb). Up with therapy Discharge home with home health Diet - Regular diet Follow up - in 2 weeks Activity - WBAT Disposition - Home Condition Upon Discharge - Good D/C Meds - See DC Summary DVT Prophylaxis - Xarelto  Alzena Gerber 01/17/2013, 9:19 AM

## 2013-01-17 NOTE — Progress Notes (Signed)
Physical Therapy Treatment Patient Details Name: CAI ANFINSON MRN: [id number] DOB: 03-Oct-1955 Today's Date: 01/17/2013 Time: 9528-4132 PT Time Calculation (min): 29 min  PT Assessment / Plan / Recommendation  History of Present Illness     PT Comments   POD # 2 amb in hallway, practiced on steps again and performed TE's. Pt plans to D/C to home today.  Follow Up Recommendations  Home health PT     Does the patient have the potential to tolerate intense rehabilitation     Barriers to Discharge        Equipment Recommendations       Recommendations for Other Services    Frequency 7X/week   Progress towards PT Goals Progress towards PT goals: Progressing toward goals  Plan      Precautions / Restrictions Precautions Precautions: Fall Restrictions Weight Bearing Restrictions: No Other Position/Activity Restrictions: WBAT   Pertinent Vitals/Pain  C/o "soreness'    Mobility  Bed Mobility Bed Mobility: Not assessed Details for Bed Mobility Assistance: pt OOB in recliner Transfers Transfers: Sit to Stand;Stand to Sit Sit to Stand: 6: Modified independent (Device/Increase time);From chair/3-in-1 Stand to Sit: 6: Modified independent (Device/Increase time);To chair/3-in-1 Details for Transfer Assistance: increased time Ambulation/Gait Ambulation/Gait Assistance: 6: Modified independent (Device/Increase time) Ambulation Distance (Feet): 450 Feet Assistive device: Rolling walker Ambulation/Gait Assistance Details: increased time and one VC safety with backward gait Gait Pattern: Step-to pattern;Shuffle;Decreased step length - right;Decreased step length - left;Trunk flexed Gait velocity: decreased Stairs: Yes Stairs Assistance: 5: Supervision;4: Min guard Stairs Assistance Details (indicate cue type and reason): increased time Stair Management Technique: No rails;Forwards;With crutches Number of Stairs: 4    Exercises  10 reps standing TE's of hip flex, ABD, Ext  and knee flex Followed by [facility]    PT Goals (current goals can now be found in the care plan section)    Visit Information  Last PT Received On: 01/17/13 Assistance Needed: +1    Subjective Data      Cognition       Balance     End of Session PT - End of Session Equipment Utilized During Treatment: Gait belt Activity Tolerance: Patient tolerated treatment well Patient left: in chair;with call bell/phone within reach Nurse Communication: Mobility status (ready for D/C to home)   Felecia Shelling  PTA [facility]  Acute  Rehab Pager      [phone]

## 2013-02-13 ENCOUNTER — Other Ambulatory Visit: Payer: Self-pay

## 2013-10-30 ENCOUNTER — Ambulatory Visit: Payer: BC Managed Care – PPO | Admitting: Internal Medicine

## 2013-12-04 ENCOUNTER — Encounter: Payer: Self-pay | Admitting: Internal Medicine

## 2013-12-04 ENCOUNTER — Ambulatory Visit (INDEPENDENT_AMBULATORY_CARE_PROVIDER_SITE_OTHER): Payer: BC Managed Care – PPO | Admitting: Internal Medicine

## 2013-12-04 VITALS — BP 110/66 | HR 61 | Ht 73.0 in | Wt 240.8 lb

## 2013-12-04 DIAGNOSIS — G4733 Obstructive sleep apnea (adult) (pediatric): Secondary | ICD-10-CM

## 2013-12-04 NOTE — Progress Notes (Signed)
Subjective:    Patient ID: James Mack, male    DOB: 06-05-1955, 58 y.o.   MRN: [id number]  HPI 08/25/10- 69 yoM never smoker, followed for OSA with hx allergic rhinitis and childhood asthma.  Last here March 25, 2010. Since then he has continued to get colds from co-worker with small children.  He uses CPAP all night every night and for travel. No problems reported. Girlfriend is ok with it and will stay in the room now, which was one of his concerns. Nasal mask. [facility].  08/25/11- 16 yoM never smoker, followed for OSA with hx allergic rhinitis and childhood asthma. Comes with no complaints today. Describes good compliance and control with CPAP at 9 CWP/[facility]. Not been bothered much by the spring pollen. Occasional antihistamine.  10/25/12- 24 yoM never smoker, followed for OSA with hx allergic rhinitis and childhood asthma. Wearing cpap 9/ [facility] "pretty much" every night.  No problems with mask or pressure.  Will need new supplies.   Humidifier attachment really helps. Girlfriend does not report snoring. Scheduled hip replacement in October. I advised him to tell his surgeon and anesthesiologist that he uses CPAP, so it will be continued in the hospital.  12/04/13- 58 yoM never smoker, followed for OSA with hx allergic rhinitis and childhood asthma. CPAP 9/ [facility] FOLLOWS FOR: wears CPAP every night about 4 to 4.5 hours; pressure works well for patient; DME is [facility]; patient would like to discuss getting a new machine   CXR 01/03/13 IMPRESSION:  No active cardiopulmonary disease.  Electronically Signed  By: Lahoma Crocker  On: 01/03/2013 15:11  ROS-see HPI Constitutional:   No-   weight loss, night sweats, fevers, chills, fatigue, lassitude. HEENT:   No-  headaches, difficulty swallowing, tooth/dental problems, sore throat,       No-  sneezing, itching, ear ache, nasal congestion, post nasal drip,  CV:  No-   chest pain, orthopnea, PND, swelling in  lower extremities, anasarca, dizziness, palpitations Resp: No-   shortness of breath with exertion or at rest.              No-   productive cough,  No non-productive cough,  No- coughing up of blood.              No-   change in color of mucus.  No- wheezing.   Skin: No-   rash or lesions. GI:  No-   heartburn, indigestion, abdominal pain, nausea, vomiting, GU:  MS:  No-   joint pain or swelling.  . Neuro-     nothing unusual Psych:  No- change in mood or affect. No depression or anxiety.  No memory loss.  OBJ- Physical Exam General- Alert, Oriented, Affect-appropriate, Distress- none acute, medium build Skin- rash-none, lesions- none, excoriation- none Lymphadenopathy- none Head- atraumatic            Eyes- Gross vision intact, PERRLA, conjunctivae and secretions clear            Ears- Hearing, canals-normal            Nose- Clear, no-Septal dev, mucus, polyps, erosion, perforation             Throat- Mallampati II-III , mucosa clear , drainage- none, tonsils- atrophic Neck- flexible , trachea midline, no stridor , thyroid nl, carotid no bruit Chest - symmetrical excursion , unlabored           Heart/CV- RRR , no murmur , no gallop  ,  no rub, nl s1 s2                           - JVD- none , edema- none, stasis changes- none, varices- none           Lung- clear to P&A, wheeze- none, cough- none , dullness-none, rub- none           Chest wall-  Abd- Br/ Gen/ Rectal- Not done, not indicated Extrem- cyanosis- none, clubbing, none, atrophy- none, strength- nl Neuro- grossly intact to observation

## 2013-12-04 NOTE — Patient Instructions (Addendum)
Order- DME [facility]- download CPAP pressure compliance    Dx OSA  You can ask [facility] when you might be able to get a new CPAP machine  Please call if we can help

## 2014-02-04 ENCOUNTER — Encounter: Payer: Self-pay | Admitting: Neurology

## 2014-02-04 ENCOUNTER — Ambulatory Visit (INDEPENDENT_AMBULATORY_CARE_PROVIDER_SITE_OTHER): Payer: BC Managed Care – PPO | Admitting: Neurology

## 2014-02-04 VITALS — BP 126/70 | HR 74 | Resp 16 | Ht 73.0 in | Wt 241.6 lb

## 2014-02-04 DIAGNOSIS — G43809 Other migraine, not intractable, without status migrainosus: Secondary | ICD-10-CM

## 2014-02-04 DIAGNOSIS — G43109 Migraine with aura, not intractable, without status migrainosus: Secondary | ICD-10-CM

## 2014-02-04 NOTE — Patient Instructions (Addendum)
1.  For sake of completeness, we will get MRI of the brain with and without contrast [facility]  02/13/14  10:10am  2.  When you get the visual aura, wait and see if you get the headache.  At the earliest onset of the headache, take either Advil, Aleve or Excedrin Migraine.  If these over the counter medications do not help, call and I can prescribe a medication for the headache. 3.  Keep a diary so the next time you get an episode, write down what foods you have been eating or possibly and scents that could have triggered them 4.  Follow up in  6 months.

## 2014-02-04 NOTE — Progress Notes (Signed)
NEUROLOGY CONSULTATION NOTE  RYO KLANG MRN: [id number] DOB: Feb 06, 1956  Referring provider: Dr. Leola Brazil Primary care provider: Dr. Rex Kras  Reason for consult:  headache  HISTORY OF PRESENT ILLNESS: James Mack is a 58 year old right-handed man with OSA on CPAP (controlled), gout,  GERD and skin cancer from the face (type unknown) who presents for headache.  About 3 years ago, he was sitting at the computer when she suddenly had trouble seeing the screen.  He then developed binocular white-out with tunnel vision, lasting a few minutes.  There was no subsequent headache.  Over the next three years, he would periodically have brief visual symptoms, sometimes with vision jumping or partial field cut.  They would occur every 3 or 4 times a year.  About 3 months ago, he was at the computer again when he looked at his finger on the mouse and noticed he could see his finger but not his fingernail.  He went to sleep and when he woke up, he had a severe posterior headache with no associated symptoms.  He took Advil but it lasted until the following morning.  He has remote history of mild headaches but nothing like this.  He thinks cashews may be a trigger.  His mother and sister have migraines.  Labs from 10/15/13 showed TSH of 1.930 and vitamin D 37.6.  PAST MEDICAL HISTORY: Past Medical History  Diagnosis Date  . Asthma     childhood  . Allergic rhinitis   . Snoring   . OSA (obstructive sleep apnea)     w/ insomnia NPSG 02/08/10-AHI 6.2 hr  . GERD (gastroesophageal reflux disease)     occasional  . Headache(784.0)     ocular migraines  . Arthritis     hips, fingers  . Cancer     skin cancer  . PONV (postoperative nausea and vomiting)     PAST SURGICAL HISTORY: Past Surgical History  Procedure Laterality Date  . Right hip replaced  2001  . Retinal detachment surgery Right 2007  . Tonsillectomy    . Skin cancer excision    . Eye surgery      large "floater" from  detachment that did not dissolve was removed  . Hernia repair Left   . Hip arthroscopy Right   . Total hip arthroplasty Left 01/15/2013    Procedure: LEFT TOTAL HIP ARTHROPLASTY ANTERIOR APPROACH;  Surgeon: Gearlean Alf, MD;  Location: [facility];  Service: Orthopedics;  Laterality: Left;    MEDICATIONS: Current Outpatient Prescriptions on File Prior to Visit  Medication Sig Dispense Refill  . allopurinol (ZYLOPRIM) 300 MG tablet Take 300 mg by mouth daily.       Marland Kitchen aspirin 81 MG tablet Take 81 mg by mouth daily.       No current facility-administered medications on file prior to visit.    ALLERGIES: No Known Allergies  FAMILY HISTORY: Family History  Problem Relation Age of Onset  . Breast cancer Mother   . Sleep apnea Sister   . Cancer Sister     SOCIAL HISTORY: History   Social History  . Marital Status: Divorced    Spouse Name: N/A    Number of Children: N/A  . Years of Education: N/A   Occupational History  . Not on file.   Social History Main Topics  . Smoking status: Never Smoker   . Smokeless tobacco: Never Used  . Alcohol Use: No  . Drug Use: No  . Sexual Activity:  No   Other Topics Concern  . Not on file   Social History Narrative  . No narrative on file    REVIEW OF SYSTEMS: Constitutional: No fevers, chills, or sweats, no generalized fatigue, change in appetite Eyes: No visual changes, double vision, eye pain Ear, nose and throat: No hearing loss, ear pain, nasal congestion, sore throat Cardiovascular: No chest pain, palpitations Respiratory:  No shortness of breath at rest or with exertion, wheezes GastrointestinaI: No nausea, vomiting, diarrhea, abdominal pain, fecal incontinence Genitourinary:  No dysuria, urinary retention or frequency Musculoskeletal:  No neck pain, back pain Integumentary: No rash, pruritus, skin lesions Neurological: as above Psychiatric: No depression, insomnia, anxiety Endocrine: No palpitations, fatigue, diaphoresis,  mood swings, change in appetite, change in weight, increased thirst Hematologic/Lymphatic:  No anemia, purpura, petechiae. Allergic/Immunologic: no itchy/runny eyes, nasal congestion, recent allergic reactions, rashes  PHYSICAL EXAM: Filed Vitals:   02/04/14 1238  BP: 126/70  Pulse: 74  Resp: 16   General: No acute distress Head:  Normocephalic/atraumatic Neck: supple, no paraspinal tenderness, full range of motion Back: No paraspinal tenderness Heart: regular rate and rhythm Lungs: Clear to auscultation bilaterally. Vascular: No carotid bruits. Neurological Exam: Mental status: alert and oriented to person, place, and time, recent and remote memory intact, fund of knowledge intact, attention and concentration intact, speech fluent and not dysarthric, language intact. Cranial nerves: CN I: not tested CN II: pupils equal, round and reactive to light, visual fields intact, fundi unremarkable, without vessel changes, exudates, hemorrhages or papilledema. CN III, IV, VI:  full range of motion, no nystagmus, no ptosis CN V: facial sensation intact CN VII: upper and lower face symmetric CN VIII: hearing intact CN IX, X: gag intact, uvula midline CN XI: sternocleidomastoid and trapezius muscles intact CN XII: tongue midline Bulk & Tone: normal, no fasciculations. Motor: 5/5 throughout Sensation: temperature and vibration intact Deep Tendon Reflexes: 2+ throughout, toes downgoing Finger to nose testing: no dysmetria Heel to shin: no dysmetria Gait: normal station and stride.  Able to turn and walk in tandem. Romberg negative.  IMPRESSION: Visual migraine aura without headache Migraine with visual aura  PLAN: 1.  For abortive therapy, take OTC medication at earliest onset.  If not effective, will prescribe triptan 2.  To rule out secondary etiology, will get MRI of the brain 3.  Follow up in 6 months  Thank you for allowing me to take part in the care of this patient.  Metta Clines, DO  CC: Shauna Hugh, MD         Hulan Fess, MD

## 2014-02-13 ENCOUNTER — Telehealth: Payer: Self-pay | Admitting: *Deleted

## 2014-02-13 ENCOUNTER — Ambulatory Visit
Admission: RE | Admit: 2014-02-13 | Discharge: 2014-02-13 | Disposition: A | Discharge status: Home / Self Care | Payer: BC Managed Care – PPO | Source: Ambulatory Visit | Attending: Neurology | Admitting: Neurology

## 2014-02-13 DIAGNOSIS — G43109 Migraine with aura, not intractable, without status migrainosus: Secondary | ICD-10-CM

## 2014-02-13 MED ORDER — GADOBENATE DIMEGLUMINE 529 MG/ML IV SOLN
20.0000 mL | Freq: Once | INTRAVENOUS | Status: AC | PRN
  Administered 2014-02-13: 20 mL via INTRAVENOUS

## 2014-02-27 NOTE — Telephone Encounter (Signed)
error 

## 2014-05-07 ENCOUNTER — Ambulatory Visit
Admission: RE | Admit: 2014-05-07 | Discharge: 2014-05-07 | Disposition: A | Discharge status: Home / Self Care | Payer: BLUE CROSS/BLUE SHIELD | Source: Ambulatory Visit | Attending: Unknown Physician Specialty | Admitting: Unknown Physician Specialty

## 2014-05-07 ENCOUNTER — Other Ambulatory Visit: Payer: Self-pay | Admitting: Unknown Physician Specialty

## 2014-05-07 DIAGNOSIS — R053 Chronic cough: Secondary | ICD-10-CM

## 2014-05-07 DIAGNOSIS — R05 Cough: Secondary | ICD-10-CM

## 2014-06-11 ENCOUNTER — Telehealth: Payer: Self-pay | Admitting: Neurology

## 2014-06-11 NOTE — Telephone Encounter (Signed)
Pt canceled appt to see Dr Tomi Likens on 07-27-14 and did not resch at this time

## 2014-07-27 ENCOUNTER — Ambulatory Visit: Payer: BLUE CROSS/BLUE SHIELD | Admitting: Neurology

## 2014-08-07 ENCOUNTER — Ambulatory Visit: Payer: BC Managed Care – PPO | Admitting: Neurology

## 2014-12-04 ENCOUNTER — Ambulatory Visit: Payer: BC Managed Care – PPO | Admitting: Internal Medicine

## 2014-12-07 ENCOUNTER — Ambulatory Visit (INDEPENDENT_AMBULATORY_CARE_PROVIDER_SITE_OTHER): Payer: BLUE CROSS/BLUE SHIELD | Admitting: Internal Medicine

## 2014-12-07 ENCOUNTER — Ambulatory Visit: Payer: BLUE CROSS/BLUE SHIELD | Admitting: Internal Medicine

## 2014-12-07 ENCOUNTER — Encounter: Payer: Self-pay | Admitting: Internal Medicine

## 2014-12-07 VITALS — BP 130/74 | HR 62 | Ht 73.0 in | Wt 233.8 lb

## 2014-12-07 DIAGNOSIS — G4733 Obstructive sleep apnea (adult) (pediatric): Secondary | ICD-10-CM

## 2014-12-07 NOTE — Patient Instructions (Signed)
Order- DME [facility]    Replacement for old CPAP machine 9 cwp, mask of choice, humidifier, supplies,                      Consider Transcend portable CPAP device         Dx OSA   You can check on-line sources like CPAP.com to price shop for Transcend.

## 2014-12-07 NOTE — Progress Notes (Signed)
Subjective:    Patient ID: James Mack, male    DOB: [birth year], 59 y.o.   MRN: [id number]  HPI 08/25/10- 1 yoM never smoker, followed for OSA with hx allergic rhinitis and childhood asthma.  Last here March 25, 2010. Since then he has continued to get colds from co-worker with small children.  He uses CPAP all night every night and for travel. No problems reported. Girlfriend is ok with it and will stay in the room now, which was one of his concerns. Nasal mask. [facility].  08/25/11- 66 yoM never smoker, followed for OSA with hx allergic rhinitis and childhood asthma. Comes with no complaints today. Describes good compliance and control with CPAP at 9 CWP/[facility]. Not been bothered much by the spring pollen. Occasional antihistamine.  10/25/12- 7 yoM never smoker, followed for OSA with hx allergic rhinitis and childhood asthma. Wearing cpap 9/ [facility] "pretty much" every night.  No problems with mask or pressure.  Will need new supplies.   Humidifier attachment really helps. Girlfriend does not report snoring. Scheduled hip replacement in October. I advised him to tell his surgeon and anesthesiologist that he uses CPAP, so it will be continued in the hospital.  12/04/13- 58 yoM never smoker, followed for OSA with hx allergic rhinitis and childhood asthma. CPAP 9/ [facility] FOLLOWS FOR: wears CPAP every night about 4 to 4.5 hours; pressure works well for patient; DME is [facility]; patient would like to discuss getting a new machine CXR 01/03/13 IMPRESSION:  No active cardiopulmonary disease.  Electronically Signed  By: Lahoma Crocker  On: 01/03/2013 15:11  12/07/14- 58 yoM never smoker, followed for OSA with hx allergic rhinitis and childhood asthma. CPAP 9/ [facility] FOLLOWS FOR: Wear CPAP 9 every night for 5 hours; DME is [facility].  No recent DL-will need to place order and enroll in airview if able.  He remains very comfortable with CPAP without changes.  We discussed comfort measures, sleep habits  ROS-see HPI Constitutional:   No-   weight loss, night sweats, fevers, chills, fatigue, lassitude. HEENT:   No-  headaches, difficulty swallowing, tooth/dental problems, sore throat,       No-  sneezing, itching, ear ache, nasal congestion, post nasal drip,  CV:  No-   chest pain, orthopnea, PND, swelling in lower extremities, anasarca, dizziness, palpitations Resp: No-   shortness of breath with exertion or at rest.              No-   productive cough,  No non-productive cough,  No- coughing up of blood.              No-   change in color of mucus.  No- wheezing.   Skin: No-   rash or lesions. GI:  No-   heartburn, indigestion, abdominal pain, nausea, vomiting, GU:  MS:  No-   joint pain or swelling.  . Neuro-     nothing unusual Psych:  No- change in mood or affect. No depression or anxiety.  No memory loss.  OBJ- Physical Exam General- Alert, Oriented, Affect-appropriate, Distress- none acute, medium build Skin- rash-none, lesions- none, excoriation- none Lymphadenopathy- none Head- atraumatic            Eyes- Gross vision intact, PERRLA, conjunctivae and secretions clear            Ears- Hearing, canals-normal            Nose- Clear, no-Septal dev, mucus, polyps, erosion, perforation  Throat- Mallampati II-III , mucosa clear , drainage- none, tonsils- atrophic Neck- flexible , trachea midline, no stridor , thyroid nl, carotid no bruit Chest - symmetrical excursion , unlabored           Heart/CV- RRR , no murmur , no gallop  , no rub, nl s1 s2                           - JVD- none , edema- none, stasis changes- none, varices- none           Lung- clear to P&A, wheeze- none, cough- none , dullness-none, rub- none           Chest wall-  Abd- Br/ Gen/ Rectal- Not done, not indicated Extrem- cyanosis- none, clubbing, none, atrophy- none, strength- nl Neuro- grossly intact to observation

## 2014-12-08 NOTE — Assessment & Plan Note (Addendum)
Pressure setting and mask function are good. His machine is getting old and eligible for replacement. He wanted to talk about small portable machines. Plan-download for documentation. We reviewed goals of CPAP. He doesn't want to sleep without it

## 2014-12-21 ENCOUNTER — Telehealth: Payer: Self-pay | Admitting: Internal Medicine

## 2014-12-21 DIAGNOSIS — G4733 Obstructive sleep apnea (adult) (pediatric): Secondary | ICD-10-CM

## 2014-12-21 NOTE — Telephone Encounter (Signed)
Patient called to make sure that order for CPAP machine was sent to correct DME company. Order had not been placed Entered new order with correct DME Patient notified that order has been placed Nothing further needed.

## 2015-08-02 ENCOUNTER — Encounter: Payer: Self-pay | Admitting: Internal Medicine

## 2015-09-08 ENCOUNTER — Ambulatory Visit
Admission: RE | Admit: 2015-09-08 | Discharge: 2015-09-08 | Disposition: A | Discharge status: Home / Self Care | Payer: BLUE CROSS/BLUE SHIELD | Source: Ambulatory Visit | Attending: Family Medicine | Admitting: Family Medicine

## 2015-09-08 ENCOUNTER — Other Ambulatory Visit (HOSPITAL_COMMUNITY): Payer: Self-pay | Admitting: Family Medicine

## 2015-09-08 DIAGNOSIS — R52 Pain, unspecified: Secondary | ICD-10-CM

## 2015-12-09 ENCOUNTER — Ambulatory Visit: Payer: BLUE CROSS/BLUE SHIELD | Admitting: Internal Medicine

## 2016-01-06 ENCOUNTER — Encounter: Payer: Self-pay | Admitting: Internal Medicine

## 2016-01-06 ENCOUNTER — Ambulatory Visit (INDEPENDENT_AMBULATORY_CARE_PROVIDER_SITE_OTHER): Payer: BLUE CROSS/BLUE SHIELD | Admitting: Internal Medicine

## 2016-01-06 DIAGNOSIS — J302 Other seasonal allergic rhinitis: Secondary | ICD-10-CM

## 2016-01-06 DIAGNOSIS — G4733 Obstructive sleep apnea (adult) (pediatric): Secondary | ICD-10-CM

## 2016-01-06 DIAGNOSIS — J3089 Other allergic rhinitis: Secondary | ICD-10-CM

## 2016-01-06 DIAGNOSIS — J309 Allergic rhinitis, unspecified: Secondary | ICD-10-CM

## 2016-01-06 NOTE — Assessment & Plan Note (Signed)
Irritant rhinitis after doing yard work outside [employer] nasal saline spray. He may eventually need Flonase

## 2016-01-06 NOTE — Progress Notes (Signed)
Subjective:    Patient ID: James Mack, male    DOB: Apr 27, 1955, 60 y.o.   MRN: [id number]  HPI M never smoker, followed for OSA with hx allergic rhinitis and childhood asthma.    12/07/14- 58 yoM never smoker, followed for OSA with hx allergic rhinitis and childhood asthma. CPAP 9/ [facility] FOLLOWS FOR: Wear CPAP 9 every night for 5 hours; DME is [facility].  No recent DL-will need to place order and enroll in airview if able.  He remains very comfortable with CPAP without changes. We discussed comfort measures, sleep habits  01/06/2016-59 year old male never smoker followed for OSA, history allergic rhinitis, childhood asthma CPAP 9/[facility] FOLLOWS FOR: DMe [facility]; pt states he wears CPAP about 4 hours(as long as he sleeps) each night and pressure works well for patient. Send order for new supplies to have on file. DL attached.  Download-73% 4 hour compliance with AHI 1.4/hour indicating good control. He says CPAP is very comfortable. Sleeps better with it. No more snoring. Nose feels irritated after using a chipper and doing yard work without a mask. Flu vaccine at work  ROS-see HPI Constitutional:   No-   weight loss, night sweats, fevers, chills, fatigue, lassitude. HEENT:   No-  headaches, difficulty swallowing, tooth/dental problems, sore throat,       No-  sneezing, itching, ear ache, + nasal congestion, post nasal drip,  CV:  No-   chest pain, orthopnea, PND, swelling in lower extremities, anasarca, dizziness, palpitations Resp: No-   shortness of breath with exertion or at rest.              No-   productive cough,  No non-productive cough,  No- coughing up of blood.              No-   change in color of mucus.  No- wheezing.   Skin: No-   rash or lesions. GI:  No-   heartburn, indigestion, abdominal pain, nausea, vomiting, GU:  MS:  No-   joint pain or swelling.  . Neuro-     nothing unusual Psych:  No- change in mood or affect. No depression or  anxiety.  No memory loss.  OBJ- Physical Exam General- Alert, Oriented, Affect-appropriate, Distress- none acute, medium build Skin- rash-none, lesions- none, excoriation- none Lymphadenopathy- none Head- atraumatic            Eyes- Gross vision intact, PERRLA, conjunctivae and secretions clear            Ears- Hearing, canals-normal            Nose- Clear, no-Septal dev, mucus, polyps, erosion, perforation             Throat- Mallampati II-III , mucosa clear , drainage- none, tonsils- atrophic Neck- flexible , trachea midline, no stridor , thyroid nl, carotid no bruit Chest - symmetrical excursion , unlabored           Heart/CV- RRR , no murmur , no gallop  , no rub, nl s1 s2                           - JVD- none , edema- none, stasis changes- none, varices- none           Lung- clear to P&A, wheeze- none, cough- none , dullness-none, rub- none           Chest wall-  Abd- Br/ Gen/ Rectal-  Not done, not indicated Extrem- cyanosis- none, clubbing, none, atrophy- none, strength- nl Neuro- grossly intact to observation

## 2016-01-06 NOTE — Assessment & Plan Note (Signed)
Adequate compliance at 73%/4 hour but he could do better as discussed. We reviewed his download and looked at the graphics. He is comfortable with the pressure

## 2016-01-06 NOTE — Patient Instructions (Signed)
Order- DME [facility]   We can continue CPAP 9, mask of choice, humidifier, supplies, AirView  Dx OSA  Consider trying an otc saline nasal spray to help with uncomfortable dryness  Please call as needed

## 2016-01-17 ENCOUNTER — Encounter: Payer: Self-pay | Admitting: Internal Medicine

## 2016-11-07 DIAGNOSIS — M7541 Impingement syndrome of right shoulder: Secondary | ICD-10-CM | POA: Diagnosis not present

## 2016-11-07 DIAGNOSIS — M19011 Primary osteoarthritis, right shoulder: Secondary | ICD-10-CM | POA: Diagnosis not present

## 2016-11-07 DIAGNOSIS — M25511 Pain in right shoulder: Secondary | ICD-10-CM | POA: Diagnosis not present

## 2016-11-07 DIAGNOSIS — G8929 Other chronic pain: Secondary | ICD-10-CM | POA: Diagnosis not present

## 2016-11-07 DIAGNOSIS — M25711 Osteophyte, right shoulder: Secondary | ICD-10-CM | POA: Diagnosis not present

## 2016-11-20 ENCOUNTER — Other Ambulatory Visit: Payer: Self-pay | Admitting: Orthopedic Surgery

## 2016-11-20 DIAGNOSIS — M2351 Chronic instability of knee, right knee: Secondary | ICD-10-CM

## 2016-11-20 DIAGNOSIS — M25511 Pain in right shoulder: Secondary | ICD-10-CM

## 2016-11-27 ENCOUNTER — Ambulatory Visit
Admission: RE | Admit: 2016-11-27 | Discharge: 2016-11-27 | Disposition: A | Discharge status: Home / Self Care | Payer: BLUE CROSS/BLUE SHIELD | Source: Ambulatory Visit | Attending: Orthopedic Surgery | Admitting: Orthopedic Surgery

## 2016-11-27 DIAGNOSIS — M25511 Pain in right shoulder: Secondary | ICD-10-CM

## 2016-11-27 DIAGNOSIS — M19011 Primary osteoarthritis, right shoulder: Secondary | ICD-10-CM | POA: Diagnosis not present

## 2016-11-28 DIAGNOSIS — M75111 Incomplete rotator cuff tear or rupture of right shoulder, not specified as traumatic: Secondary | ICD-10-CM | POA: Diagnosis not present

## 2016-11-28 DIAGNOSIS — G8929 Other chronic pain: Secondary | ICD-10-CM | POA: Diagnosis not present

## 2016-11-28 DIAGNOSIS — M25811 Other specified joint disorders, right shoulder: Secondary | ICD-10-CM | POA: Diagnosis not present

## 2016-11-28 DIAGNOSIS — M7541 Impingement syndrome of right shoulder: Secondary | ICD-10-CM | POA: Diagnosis not present

## 2017-01-12 DIAGNOSIS — H353122 Nonexudative age-related macular degeneration, left eye, intermediate dry stage: Secondary | ICD-10-CM | POA: Diagnosis not present

## 2017-01-12 DIAGNOSIS — H43813 Vitreous degeneration, bilateral: Secondary | ICD-10-CM | POA: Diagnosis not present

## 2017-01-12 DIAGNOSIS — H2512 Age-related nuclear cataract, left eye: Secondary | ICD-10-CM | POA: Diagnosis not present

## 2017-01-12 DIAGNOSIS — H5212 Myopia, left eye: Secondary | ICD-10-CM | POA: Diagnosis not present

## 2017-01-17 DIAGNOSIS — G8918 Other acute postprocedural pain: Secondary | ICD-10-CM | POA: Diagnosis not present

## 2017-01-17 DIAGNOSIS — M659 Synovitis and tenosynovitis, unspecified: Secondary | ICD-10-CM | POA: Diagnosis not present

## 2017-01-17 DIAGNOSIS — M75111 Incomplete rotator cuff tear or rupture of right shoulder, not specified as traumatic: Secondary | ICD-10-CM | POA: Diagnosis not present

## 2017-01-17 DIAGNOSIS — M19011 Primary osteoarthritis, right shoulder: Secondary | ICD-10-CM | POA: Diagnosis not present

## 2017-01-17 DIAGNOSIS — M24111 Other articular cartilage disorders, right shoulder: Secondary | ICD-10-CM | POA: Diagnosis not present

## 2017-01-17 DIAGNOSIS — M7541 Impingement syndrome of right shoulder: Secondary | ICD-10-CM | POA: Diagnosis not present

## 2017-01-23 DIAGNOSIS — M25611 Stiffness of right shoulder, not elsewhere classified: Secondary | ICD-10-CM | POA: Diagnosis not present

## 2017-01-23 DIAGNOSIS — Z9889 Other specified postprocedural states: Secondary | ICD-10-CM | POA: Diagnosis not present

## 2017-01-23 DIAGNOSIS — G8929 Other chronic pain: Secondary | ICD-10-CM | POA: Diagnosis not present

## 2017-01-23 DIAGNOSIS — M25511 Pain in right shoulder: Secondary | ICD-10-CM | POA: Diagnosis not present

## 2017-06-18 ENCOUNTER — Emergency Department (HOSPITAL_COMMUNITY): Payer: BLUE CROSS/BLUE SHIELD

## 2017-06-18 ENCOUNTER — Emergency Department (HOSPITAL_COMMUNITY)
Admission: EM | Admit: 2017-06-18 | Discharge: 2017-06-18 | Disposition: A | Discharge status: Home / Self Care | Payer: BLUE CROSS/BLUE SHIELD | Attending: Emergency Medicine | Admitting: Emergency Medicine

## 2017-06-18 ENCOUNTER — Other Ambulatory Visit: Payer: Self-pay

## 2017-06-18 ENCOUNTER — Encounter (HOSPITAL_COMMUNITY): Payer: Self-pay | Admitting: Emergency Medicine

## 2017-06-18 DIAGNOSIS — Z85828 Personal history of other malignant neoplasm of skin: Secondary | ICD-10-CM | POA: Diagnosis not present

## 2017-06-18 DIAGNOSIS — Z79899 Other long term (current) drug therapy: Secondary | ICD-10-CM | POA: Insufficient documentation

## 2017-06-18 DIAGNOSIS — S73004A Unspecified dislocation of right hip, initial encounter: Secondary | ICD-10-CM | POA: Diagnosis not present

## 2017-06-18 DIAGNOSIS — Z96641 Presence of right artificial hip joint: Secondary | ICD-10-CM | POA: Diagnosis not present

## 2017-06-18 DIAGNOSIS — Y929 Unspecified place or not applicable: Secondary | ICD-10-CM | POA: Insufficient documentation

## 2017-06-18 DIAGNOSIS — J45909 Unspecified asthma, uncomplicated: Secondary | ICD-10-CM | POA: Diagnosis not present

## 2017-06-18 DIAGNOSIS — Z471 Aftercare following joint replacement surgery: Secondary | ICD-10-CM | POA: Diagnosis not present

## 2017-06-18 DIAGNOSIS — Y33XXXA Other specified events, undetermined intent, initial encounter: Secondary | ICD-10-CM | POA: Insufficient documentation

## 2017-06-18 DIAGNOSIS — Y998 Other external cause status: Secondary | ICD-10-CM | POA: Diagnosis not present

## 2017-06-18 DIAGNOSIS — Y9375 Activity, martial arts: Secondary | ICD-10-CM | POA: Diagnosis not present

## 2017-06-18 DIAGNOSIS — R030 Elevated blood-pressure reading, without diagnosis of hypertension: Secondary | ICD-10-CM | POA: Diagnosis not present

## 2017-06-18 DIAGNOSIS — Z7982 Long term (current) use of aspirin: Secondary | ICD-10-CM | POA: Insufficient documentation

## 2017-06-18 DIAGNOSIS — S79911A Unspecified injury of right hip, initial encounter: Secondary | ICD-10-CM | POA: Diagnosis not present

## 2017-06-18 DIAGNOSIS — T84020A Dislocation of internal right hip prosthesis, initial encounter: Secondary | ICD-10-CM | POA: Diagnosis not present

## 2017-06-18 MED ORDER — PROPOFOL 10 MG/ML IV BOLUS
100.0000 mg | Freq: Once | INTRAVENOUS | Status: AC
Start: 2017-06-18 — End: 2017-06-18
  Administered 2017-06-18: 200 mg via INTRAVENOUS
  Filled 2017-06-18: qty 20

## 2017-06-18 MED ORDER — HYDROCODONE-ACETAMINOPHEN 5-325 MG PO TABS: 1.0000 | ORAL_TABLET | Freq: Four times a day (QID) | ORAL | 0 refills | Status: DC | PRN

## 2017-06-18 MED ORDER — SODIUM CHLORIDE 0.9 % IV BOLUS (SEPSIS)
1000.0000 mL | Freq: Once | INTRAVENOUS | Status: AC
  Administered 2017-06-18: 1000 mL via INTRAVENOUS

## 2017-06-18 MED ORDER — KETOROLAC TROMETHAMINE 15 MG/ML IJ SOLN: 15.0000 mg | Freq: Once | INTRAMUSCULAR | Status: DC

## 2017-06-18 MED ORDER — ONDANSETRON HCL 4 MG/2ML IJ SOLN
4.0000 mg | Freq: Once | INTRAMUSCULAR | Status: AC
  Administered 2017-06-18: 4 mg via INTRAVENOUS
  Filled 2017-06-18: qty 2

## 2017-06-18 MED ORDER — IBUPROFEN 600 MG PO TABS: 600.0000 mg | ORAL_TABLET | Freq: Three times a day (TID) | ORAL | 0 refills | Status: DC | PRN

## 2017-06-18 MED ORDER — HYDROMORPHONE HCL 1 MG/ML IJ SOLN
1.0000 mg | Freq: Once | INTRAMUSCULAR | Status: AC
  Administered 2017-06-18: 1 mg via INTRAVENOUS
  Filled 2017-06-18: qty 1

## 2017-06-18 NOTE — ED Triage Notes (Signed)
Pt BIB EMS with complaints of a hip injury. Patient states he stepped backwards and felt his hip "pop out." Patient has bilateral hip replacements.  Patient complaining of no other injuries, no neck or back pain, no head pain. Patient denies LOC, no blood thinners. Patient given 200 mcg fentanyl by EMS.

## 2017-06-18 NOTE — ED Notes (Signed)
Bed: WA01 Expected date:  Expected time:  Means of arrival:  Comments: Dislocated hip

## 2017-06-18 NOTE — ED Provider Notes (Signed)
[facility] DEPT Provider Note   CSN: [id number] Arrival date & time: 06/18/17  2028     History   Chief Complaint Chief Complaint  Patient presents with  . Hip Injury    HPI James Mack is a 62 y.o. male.  HPI  62 year old male with history of bilateral hip replacements here with right hip pain.  The patient was doing karate today.  He states he was stepping backwards when he felt his hip pop out.  He experienced immediate onset of severe, aching, throbbing, right hip pain with associated spasms of his thighs.  The pain is severe.  Is worse with any movement.  No alleviating factors.  Denies any numbness or weakness.  No tingling.  Denies any falls or trauma.  No history of previous dislocations of this hip.   Past Medical History:  Diagnosis Date  . Allergic rhinitis   . Arthritis    hips, fingers  . Asthma    childhood  . Cancer ([place])    skin cancer  . GERD (gastroesophageal reflux disease)    occasional  . Headache(784.0)    ocular migraines  . OSA (obstructive sleep apnea)    w/ insomnia NPSG 02/08/10-AHI 6.2 hr  . PONV (postoperative nausea and vomiting)   . Snoring     Patient Active Problem List   Diagnosis Date Noted  . Postoperative anemia due to acute blood loss 01/16/2013  . OA (osteoarthritis) of hip 01/15/2013  . Obstructive sleep apnea 01/07/2010  . ASTHMA, CHILDHOOD 01/07/2010  . Seasonal and perennial allergic rhinitis 01/07/2010    Past Surgical History:  Procedure Laterality Date  . EYE SURGERY     large "floater" from detachment that did not dissolve was removed  . HERNIA REPAIR Left   . HIP ARTHROSCOPY Right   . RETINAL DETACHMENT SURGERY Right 2007  . right hip replaced  2001  . SKIN CANCER EXCISION    . TONSILLECTOMY    . TOTAL HIP ARTHROPLASTY Left 01/15/2013   Procedure: LEFT TOTAL HIP ARTHROPLASTY ANTERIOR APPROACH;  Surgeon: Gearlean Alf, MD;  Location: [facility] ORS;  Service: Orthopedics;   Laterality: Left;       Home Medications    Prior to Admission medications   Medication Sig Start Date End Date Taking? Authorizing Provider  allopurinol (ZYLOPRIM) 300 MG tablet Take 300 mg by mouth daily.  06/24/11  Yes [provider]  aspirin 81 MG tablet Take 81 mg by mouth daily.   Yes [provider]  cholecalciferol (VITAMIN D) 1000 UNITS tablet Take 1,000 Units by mouth daily.   Yes [provider]  furosemide (LASIX) 20 MG tablet Take 20 mg by mouth daily.   Yes [provider]  meloxicam (MOBIC) 7.5 MG tablet Take 7.5 mg by mouth daily. 04/06/17  Yes [provider]  Multiple Vitamin (MULTIVITAMIN) tablet Take 1 tablet by mouth daily.   Yes [provider]  HYDROcodone-acetaminophen (NORCO/VICODIN) 5-325 MG tablet Take 1-2 tablets by mouth every 6 (six) hours as needed for moderate pain or severe pain. 06/18/17   Duffy Bruce, MD  ibuprofen (ADVIL,MOTRIN) 600 MG tablet Take 1 tablet (600 mg total) by mouth every 8 (eight) hours as needed for moderate pain. 06/18/17   Duffy Bruce, MD    Family History Family History  Problem Relation Age of Onset  . Breast cancer Mother   . Sleep apnea Sister   . Cancer Sister     Social History Social  History   Tobacco Use  . Smoking status: Never Smoker  . Smokeless tobacco: Never Used  Substance Use Topics  . Alcohol use: No  . Drug use: No     Allergies   Patient has no known allergies.   Review of Systems Review of Systems  Musculoskeletal: Positive for arthralgias and gait problem.  All other systems reviewed and are negative.    Physical Exam Updated Vital Signs BP 131/80   Pulse (!) 58   Temp 97.9 F (36.6 C) (Oral)   Resp (!) 24   SpO2 93%   Physical Exam  Constitutional: He is oriented to person, place, and time. He appears well-developed and well-nourished. No distress.  HENT:  Head: Normocephalic and atraumatic.  Eyes: Conjunctivae are  normal.  Neck: Neck supple.  Cardiovascular: Normal rate, regular rhythm and normal heart sounds. Exam reveals no friction rub.  No murmur heard. Pulmonary/Chest: Effort normal and breath sounds normal. No respiratory distress. He has no wheezes. He has no rales.  Abdominal: He exhibits no distension.  Musculoskeletal: He exhibits no edema.  Neurological: He is alert and oriented to person, place, and time. He exhibits normal muscle tone.  Skin: Skin is warm. Capillary refill takes less than 2 seconds. No rash noted.  Psychiatric: He has a normal mood and affect.  Nursing note and vitals reviewed.   LOWER EXTREMITY EXAM: Right  INSPECTION & PALPATION: Right hip with obvious deformity.  Right lower extremity slightly shortened and internally rotated.  SENSORY: sensation is intact to light touch in:  Superficial peroneal nerve distribution (over dorsum of foot) Deep peroneal nerve distribution (over first dorsal web space) Sural nerve distribution (over lateral aspect 5th metatarsal) Saphenous nerve distribution (over medial instep)  MOTOR:  + Motor EHL (great toe dorsiflexion) + FHL (great toe plantar flexion)  + TA (ankle dorsiflexion)  + GSC (ankle plantar flexion)  VASCULAR: 2+ dorsalis pedis and posterior tibialis pulses Capillary refill < 2 sec, toes warm and well-perfused  COMPARTMENTS: Soft, warm, well-perfused No pain with passive extension No parethesias    ED Treatments / Results  Labs (all labs ordered are listed, but only abnormal results are displayed) Labs Reviewed - No data to display  EKG  EKG Interpretation None       Radiology Dg Hip Port Unilat W Or Wo Pelvis 1 View Right  Result Date: 06/18/2017 CLINICAL DATA:  62 y/o  M; status post reduction of right hip. EXAM: DG HIP (WITH OR WITHOUT PELVIS) 1V PORT RIGHT COMPARISON:  06/18/2016 pelvis radiographs FINDINGS: Interval reduction of right hip prosthesis dislocation. Normal attack elation of  left hip prosthesis. No acute fracture identified. Bony bodies are present around the right joint space. Lower lumbar degenerative changes partially visualized. IMPRESSION: Interval reduction of right hip prosthesis dislocation. Electronically Signed   By: Kristine Garbe M.D.   On: 06/18/2017 22:38   Dg Hip Unilat  With Pelvis 2-3 Views Right  Result Date: 06/18/2017 CLINICAL DATA:  Tripped and fell, with right hip deformity. Initial encounter. EXAM: DG HIP (WITH OR WITHOUT PELVIS) 2-3V RIGHT COMPARISON:  None. FINDINGS: There is superior dislocation and rotation of the patient's right hip prosthesis. No fractures are seen. The left hip prosthesis is unremarkable in appearance. Mild degenerative change is noted at the lower lumbar spine. The visualized bowel gas pattern is grossly unremarkable. IMPRESSION: Superior dislocation and rotation of the patient's right hip prosthesis. No fracture seen. Electronically Signed   By: Francoise Schaumann.D.  On: 06/18/2017 22:14    Procedures .Sedation Date/Time: 06/18/2017 11:21 PM Performed by: Duffy Bruce, MD Authorized by: Duffy Bruce, MD   Consent:    Consent obtained:  Verbal   Consent given by:  Patient   Risks discussed:  Allergic reaction, dysrhythmia, inadequate sedation, nausea, prolonged hypoxia resulting in organ damage, prolonged sedation necessitating reversal, respiratory compromise necessitating ventilatory assistance and intubation and vomiting   Alternatives discussed:  Analgesia without sedation, anxiolysis and regional anesthesia Universal protocol:    Procedure explained and questions answered to patient or proxy's satisfaction: yes     Relevant documents present and verified: yes     Test results available and properly labeled: yes     Imaging studies available: yes     Required blood products, implants, devices, and special equipment available: yes     Site/side marked: yes     Immediately prior to procedure a time  out was called: yes     Patient identity confirmation method:  Verbally with patient Indications:    Procedure necessitating sedation performed by:  Physician performing sedation   Intended level of sedation:  Deep Pre-sedation assessment:    Time since last food or drink:  4   ASA classification: class 1 - normal, healthy patient     Neck mobility: normal     Mouth opening:  3 or more finger widths   Thyromental distance:  4 finger widths   Mallampati score:  I - soft palate, uvula, fauces, pillars visible   Pre-sedation assessments completed and reviewed: airway patency, cardiovascular function, hydration status, mental status, nausea/vomiting, pain level, respiratory function and temperature   Immediate pre-procedure details:    Reassessment: Patient reassessed immediately prior to procedure     Reviewed: vital signs, relevant labs/tests and NPO status     Verified: bag valve mask available, emergency equipment available, intubation equipment available, IV patency confirmed, oxygen available and suction available   Procedure details (see MAR for exact dosages):    Preoxygenation:  Nasal cannula   Sedation:  Propofol   Intra-procedure monitoring:  Blood pressure monitoring, cardiac monitor, continuous pulse oximetry, frequent LOC assessments, frequent vital sign checks and continuous capnometry   Intra-procedure events: none     Intra-procedure management:  Airway repositioning and BVM ventilation   Total Provider sedation time (minutes):  20 Post-procedure details:    Attendance: Constant attendance by certified staff until patient recovered     Recovery: Patient returned to pre-procedure baseline     Post-sedation assessments completed and reviewed: airway patency, cardiovascular function, hydration status, mental status, nausea/vomiting, pain level, respiratory function and temperature     Patient is stable for discharge or admission: yes     Patient tolerance:  Tolerated well, no  immediate complications Reduction of dislocation Date/Time: 06/18/2017 11:21 PM Performed by: Duffy Bruce, MD Authorized by: Duffy Bruce, MD  Consent: Written consent obtained. Risks and benefits: risks, benefits and alternatives were discussed Consent given by: patient Patient understanding: patient states understanding of the procedure being performed Patient consent: the patient's understanding of the procedure matches consent given Procedure consent: procedure consent matches procedure scheduled Relevant documents: relevant documents present and verified Test results: test results available and properly labeled Site marked: the operative site was marked Imaging studies: imaging studies available Required items: required blood products, implants, devices, and special equipment available Patient identity confirmed: arm band Time out: Immediately prior to procedure a "time out" was called to verify the correct patient, procedure, equipment, support staff and  site/side marked as required.  Sedation: Patient sedated: yes Sedatives: propofol Analgesia: hydromorphone Vitals: Vital signs were monitored during sedation.  Patient tolerance: Patient tolerated the procedure well with no immediate complications Comments: Following procedural sedation, gentle traction was applied to the hip with internal rotation and subsequent reduction.  Palpable click was appreciated.  Plain film shows appropriate reduction.    (including critical care time)  Medications Ordered in ED Medications  ketorolac (TORADOL) 15 MG/ML injection 15 mg (not administered)  HYDROmorphone (DILAUDID) injection 1 mg (1 mg Intravenous Given 06/18/17 2143)  sodium chloride 0.9 % bolus 1,000 mL (0 mLs Intravenous Stopped 06/18/17 2248)  propofol (DIPRIVAN) 10 mg/mL bolus/IV push 100 mg (200 mg Intravenous Given 06/18/17 2203)  ondansetron (ZOFRAN) injection 4 mg (4 mg Intravenous Given 06/18/17 2144)     Initial  Impression / Assessment and Plan / ED Course  I have reviewed the triage vital signs and the nursing notes.  Pertinent labs & imaging results that were available during my care of the patient were reviewed by me and considered in my medical decision making (see chart for details).     62 year old male here with right hip dislocation.  Plain film shows no fracture.  Patient was sedated and hip was reduced as above.  The patient tolerated the procedure well.  Knee immobilizer placed, patient given crutches, and he will follow-up with his orthopedist.  Distal neuro vasculature remains fully intact pre-and postreduction.  Final Clinical Impressions(s) / ED Diagnoses   Final diagnoses:  Dislocation of right hip, initial encounter [facility])    ED Discharge Orders        Ordered    HYDROcodone-acetaminophen (NORCO/VICODIN) 5-325 MG tablet  Every 6 hours PRN     06/18/17 2255    ibuprofen (ADVIL,MOTRIN) 600 MG tablet  Every 8 hours PRN     06/18/17 2305       Duffy Bruce, MD 06/18/17 2323

## 2017-06-28 DIAGNOSIS — T84020D Dislocation of internal right hip prosthesis, subsequent encounter: Secondary | ICD-10-CM | POA: Diagnosis not present

## 2017-10-10 DIAGNOSIS — G4733 Obstructive sleep apnea (adult) (pediatric): Secondary | ICD-10-CM | POA: Diagnosis not present

## 2017-10-18 DIAGNOSIS — Z961 Presence of intraocular lens: Secondary | ICD-10-CM | POA: Diagnosis not present

## 2017-10-18 DIAGNOSIS — H3321 Serous retinal detachment, right eye: Secondary | ICD-10-CM | POA: Diagnosis not present

## 2017-10-18 DIAGNOSIS — H25812 Combined forms of age-related cataract, left eye: Secondary | ICD-10-CM | POA: Diagnosis not present

## 2017-12-24 DIAGNOSIS — M9905 Segmental and somatic dysfunction of pelvic region: Secondary | ICD-10-CM | POA: Diagnosis not present

## 2017-12-24 DIAGNOSIS — M25552 Pain in left hip: Secondary | ICD-10-CM | POA: Diagnosis not present

## 2017-12-24 DIAGNOSIS — M5137 Other intervertebral disc degeneration, lumbosacral region: Secondary | ICD-10-CM | POA: Diagnosis not present

## 2017-12-24 DIAGNOSIS — M9903 Segmental and somatic dysfunction of lumbar region: Secondary | ICD-10-CM | POA: Diagnosis not present

## 2017-12-25 DIAGNOSIS — M9905 Segmental and somatic dysfunction of pelvic region: Secondary | ICD-10-CM | POA: Diagnosis not present

## 2017-12-25 DIAGNOSIS — M9903 Segmental and somatic dysfunction of lumbar region: Secondary | ICD-10-CM | POA: Diagnosis not present

## 2017-12-25 DIAGNOSIS — M5137 Other intervertebral disc degeneration, lumbosacral region: Secondary | ICD-10-CM | POA: Diagnosis not present

## 2017-12-25 DIAGNOSIS — M25552 Pain in left hip: Secondary | ICD-10-CM | POA: Diagnosis not present

## 2018-01-01 DIAGNOSIS — M25552 Pain in left hip: Secondary | ICD-10-CM | POA: Diagnosis not present

## 2018-01-01 DIAGNOSIS — M9903 Segmental and somatic dysfunction of lumbar region: Secondary | ICD-10-CM | POA: Diagnosis not present

## 2018-01-01 DIAGNOSIS — M9905 Segmental and somatic dysfunction of pelvic region: Secondary | ICD-10-CM | POA: Diagnosis not present

## 2018-01-01 DIAGNOSIS — M5137 Other intervertebral disc degeneration, lumbosacral region: Secondary | ICD-10-CM | POA: Diagnosis not present

## 2018-01-14 DIAGNOSIS — M9903 Segmental and somatic dysfunction of lumbar region: Secondary | ICD-10-CM | POA: Diagnosis not present

## 2018-01-14 DIAGNOSIS — M5137 Other intervertebral disc degeneration, lumbosacral region: Secondary | ICD-10-CM | POA: Diagnosis not present

## 2018-01-14 DIAGNOSIS — M9905 Segmental and somatic dysfunction of pelvic region: Secondary | ICD-10-CM | POA: Diagnosis not present

## 2018-01-14 DIAGNOSIS — M25552 Pain in left hip: Secondary | ICD-10-CM | POA: Diagnosis not present

## 2018-01-18 DIAGNOSIS — H353122 Nonexudative age-related macular degeneration, left eye, intermediate dry stage: Secondary | ICD-10-CM | POA: Diagnosis not present

## 2018-01-18 DIAGNOSIS — H2512 Age-related nuclear cataract, left eye: Secondary | ICD-10-CM | POA: Diagnosis not present

## 2018-01-18 DIAGNOSIS — H524 Presbyopia: Secondary | ICD-10-CM | POA: Diagnosis not present

## 2018-01-18 DIAGNOSIS — H26491 Other secondary cataract, right eye: Secondary | ICD-10-CM | POA: Diagnosis not present

## 2018-01-21 DIAGNOSIS — M9903 Segmental and somatic dysfunction of lumbar region: Secondary | ICD-10-CM | POA: Diagnosis not present

## 2018-01-21 DIAGNOSIS — M5137 Other intervertebral disc degeneration, lumbosacral region: Secondary | ICD-10-CM | POA: Diagnosis not present

## 2018-01-21 DIAGNOSIS — M25552 Pain in left hip: Secondary | ICD-10-CM | POA: Diagnosis not present

## 2018-01-21 DIAGNOSIS — M9905 Segmental and somatic dysfunction of pelvic region: Secondary | ICD-10-CM | POA: Diagnosis not present

## 2018-01-28 DIAGNOSIS — M25552 Pain in left hip: Secondary | ICD-10-CM | POA: Diagnosis not present

## 2018-01-28 DIAGNOSIS — M9905 Segmental and somatic dysfunction of pelvic region: Secondary | ICD-10-CM | POA: Diagnosis not present

## 2018-01-28 DIAGNOSIS — M5137 Other intervertebral disc degeneration, lumbosacral region: Secondary | ICD-10-CM | POA: Diagnosis not present

## 2018-01-28 DIAGNOSIS — M9903 Segmental and somatic dysfunction of lumbar region: Secondary | ICD-10-CM | POA: Diagnosis not present

## 2018-02-11 DIAGNOSIS — M25552 Pain in left hip: Secondary | ICD-10-CM | POA: Diagnosis not present

## 2018-02-11 DIAGNOSIS — M9903 Segmental and somatic dysfunction of lumbar region: Secondary | ICD-10-CM | POA: Diagnosis not present

## 2018-02-11 DIAGNOSIS — M9905 Segmental and somatic dysfunction of pelvic region: Secondary | ICD-10-CM | POA: Diagnosis not present

## 2018-02-11 DIAGNOSIS — M5137 Other intervertebral disc degeneration, lumbosacral region: Secondary | ICD-10-CM | POA: Diagnosis not present

## 2018-02-18 DIAGNOSIS — M9903 Segmental and somatic dysfunction of lumbar region: Secondary | ICD-10-CM | POA: Diagnosis not present

## 2018-02-18 DIAGNOSIS — M25552 Pain in left hip: Secondary | ICD-10-CM | POA: Diagnosis not present

## 2018-02-18 DIAGNOSIS — M9905 Segmental and somatic dysfunction of pelvic region: Secondary | ICD-10-CM | POA: Diagnosis not present

## 2018-02-18 DIAGNOSIS — M5137 Other intervertebral disc degeneration, lumbosacral region: Secondary | ICD-10-CM | POA: Diagnosis not present

## 2018-02-20 DIAGNOSIS — M5137 Other intervertebral disc degeneration, lumbosacral region: Secondary | ICD-10-CM | POA: Diagnosis not present

## 2018-02-20 DIAGNOSIS — M9905 Segmental and somatic dysfunction of pelvic region: Secondary | ICD-10-CM | POA: Diagnosis not present

## 2018-02-20 DIAGNOSIS — M25552 Pain in left hip: Secondary | ICD-10-CM | POA: Diagnosis not present

## 2018-02-20 DIAGNOSIS — M9903 Segmental and somatic dysfunction of lumbar region: Secondary | ICD-10-CM | POA: Diagnosis not present

## 2018-02-25 DIAGNOSIS — M9903 Segmental and somatic dysfunction of lumbar region: Secondary | ICD-10-CM | POA: Diagnosis not present

## 2018-02-25 DIAGNOSIS — M5137 Other intervertebral disc degeneration, lumbosacral region: Secondary | ICD-10-CM | POA: Diagnosis not present

## 2018-02-25 DIAGNOSIS — M9905 Segmental and somatic dysfunction of pelvic region: Secondary | ICD-10-CM | POA: Diagnosis not present

## 2018-02-25 DIAGNOSIS — M25552 Pain in left hip: Secondary | ICD-10-CM | POA: Diagnosis not present

## 2018-03-04 DIAGNOSIS — M5137 Other intervertebral disc degeneration, lumbosacral region: Secondary | ICD-10-CM | POA: Diagnosis not present

## 2018-03-04 DIAGNOSIS — M25552 Pain in left hip: Secondary | ICD-10-CM | POA: Diagnosis not present

## 2018-03-04 DIAGNOSIS — M9903 Segmental and somatic dysfunction of lumbar region: Secondary | ICD-10-CM | POA: Diagnosis not present

## 2018-03-04 DIAGNOSIS — M9905 Segmental and somatic dysfunction of pelvic region: Secondary | ICD-10-CM | POA: Diagnosis not present

## 2018-03-11 DIAGNOSIS — M5137 Other intervertebral disc degeneration, lumbosacral region: Secondary | ICD-10-CM | POA: Diagnosis not present

## 2018-03-11 DIAGNOSIS — M25552 Pain in left hip: Secondary | ICD-10-CM | POA: Diagnosis not present

## 2018-03-11 DIAGNOSIS — M9905 Segmental and somatic dysfunction of pelvic region: Secondary | ICD-10-CM | POA: Diagnosis not present

## 2018-03-11 DIAGNOSIS — M9903 Segmental and somatic dysfunction of lumbar region: Secondary | ICD-10-CM | POA: Diagnosis not present

## 2018-03-18 DIAGNOSIS — M9903 Segmental and somatic dysfunction of lumbar region: Secondary | ICD-10-CM | POA: Diagnosis not present

## 2018-03-18 DIAGNOSIS — M9905 Segmental and somatic dysfunction of pelvic region: Secondary | ICD-10-CM | POA: Diagnosis not present

## 2018-03-18 DIAGNOSIS — M5137 Other intervertebral disc degeneration, lumbosacral region: Secondary | ICD-10-CM | POA: Diagnosis not present

## 2018-03-18 DIAGNOSIS — M25552 Pain in left hip: Secondary | ICD-10-CM | POA: Diagnosis not present

## 2018-03-26 ENCOUNTER — Ambulatory Visit: Payer: BLUE CROSS/BLUE SHIELD | Admitting: Sports Medicine

## 2018-03-26 ENCOUNTER — Encounter: Payer: Self-pay | Admitting: Sports Medicine

## 2018-03-26 VITALS — BP 142/86 | HR 86 | Ht 72.0 in | Wt 252.2 lb

## 2018-03-26 DIAGNOSIS — M7742 Metatarsalgia, left foot: Secondary | ICD-10-CM

## 2018-03-26 DIAGNOSIS — M216X2 Other acquired deformities of left foot: Secondary | ICD-10-CM | POA: Diagnosis not present

## 2018-03-26 DIAGNOSIS — I872 Venous insufficiency (chronic) (peripheral): Secondary | ICD-10-CM

## 2018-03-26 DIAGNOSIS — R269 Unspecified abnormalities of gait and mobility: Secondary | ICD-10-CM

## 2018-03-26 DIAGNOSIS — M25872 Other specified joint disorders, left ankle and foot: Secondary | ICD-10-CM | POA: Diagnosis not present

## 2018-03-26 MED ORDER — CELECOXIB 100 MG PO CAPS: 100.0000 mg | ORAL_CAPSULE | Freq: Two times a day (BID) | ORAL | 2 refills | Status: DC | PRN

## 2018-03-26 NOTE — Progress Notes (Signed)
James Mack. James Mack, James Mack at [place]  ERICO STAN - 62 y.o. male MRN [id number]  Date of birth: Mar 25, 1956  Visit Date: 03/26/2018   PCP: Hulan Fess, MD   Referred by: Hulan Fess, MD   SUBJECTIVE:  Chief Complaint  Patient presents with  . Initial Assessment  . pain in L foot   HPI: Patient presents for evaluation of left foot pain.  He has a history of plantar fasciitis as well as bilateral hip replacements and is having worsening pain over the lateral fourth toe over the past several months but is been present for some time greater than several years.  It is worsening.  He is having some swelling.  It occasionally reported as severe especially when shoes are off.  He has some mild tingling in the foot but no significant numbness.  He has no significant weakness of the lower extremities.  Is worsened with activity and if he does not use compression.  He is had an IM injection and meloxicam without significant improvement.  Does seem to help if he rolls his foot over frozen water bottle.  REVIEW OF SYSTEMS: He has no nighttime disturbance He is recently been ill and has been having some fevers as well as GI upset.  He has been seen by his primary care physician.  HISTORY:  Prior history reviewed and updated per electronic medical record.  Social History   Occupational History  . Not on file  Tobacco Use  . Smoking status: Never Smoker  . Smokeless tobacco: Never Used  Substance and Sexual Activity  . Alcohol use: No  . Drug use: No  . Sexual activity: Never   Social History   Social History Narrative  . Not on file     DATA OBTAINED & REVIEWED:  No results for input(s): HGBA1C, LABURIC, CREATINE, CALCIUM, AST, ALT, TSH in the last 8760 hours.  Invalid input(s): MAGNESIUM, CK No problems updated. No specialty comments available.  OBJECTIVE:  VS:  HT:6' (182.9 cm)   WT:252 lb 3.2 oz (114.4 kg)   BMI:34.2    BP:(!) 142/86  HR:86bpm  TEMP: ( )  RESP:97 %   PHYSICAL EXAM: Adult male.  No acute distress.  Alert and appropriate.  His bilateral lower extremities have trace pitting edema.  He has were swelling on the left than the right.  He has splay toe of the first and second toes as well as a early bunionette and pain with metatarsal squeeze.  He has pain over the metatarsal heads.  Ankle has a small amount of swelling especially over the anterior lateral ankle.  He has crepitation with talar tilting.  Ankle has slight laxity with anterior drawer.   ASSESSMENT   1. Ankle impingement syndrome, left   2. Gait disturbance   3. Metatarsalgia of left foot   4. Loss of transverse plantar arch of left foot   5. Chronic venous insufficiency     PLAN:  Pertinent additional documentation may be included in corresponding procedure notes, imaging studies, problem based documentation and patient instructions.  Procedures:  None  Medications:  Meds ordered this encounter  Medications  . celecoxib (CELEBREX) 100 MG capsule    Sig: Take 1 capsule (100 mg total) by mouth 2 (two) times daily as needed.    Dispense:  60 capsule    Refill:  2    Discussion/Instructions: No problem-specific Assessment & Plan notes found for this encounter.  Ultimately does have breakdown of the foot as well as swelling over the ankle.  We discussed options including intra-articular injection versus systemic anti-inflammatories.  He will benefit from custom cushioned insoles and information was provided per AVS.  Cool water soaking recommended.  Return for orthotics at your convenience.          Gerda Diss, [facility] Physician

## 2018-03-26 NOTE — Patient Instructions (Addendum)
The cost of the pair of custom orthotics is $195.  You can look into having your insurance company cover the cost of these. Some insurance companies cover the cost and other do not.  If they do not you will be responsible for the full cost of the orthotics.  I am happy to do these for you at any time, you just need to let our front office schedulers know you would like an "orthotic appointment."  Please also make sure you bring athletic shoes with you on the day of your orthotic appointment or whatever shoes you plan to wear your orthotics in most frequently.   When you call your insurance company you will need to provide them the CPT code which is L3030 and there are 2 units.  You can call them  and ask if this is covered.      Frequent icing is recommended.  You can ice for 10-15 minutes at a time 3-4 times per day if needed.  It is important to be sure to ice immediately after activity. For the foot/ankle and hand/wrist it is sometimes easier and more effective to soak in a bucket of cool water.   The water should not be miserably cold, a good rule to go by his if there is any ice floating by the end of the 10-15 minutes it was likely too cold.

## 2018-04-01 ENCOUNTER — Ambulatory Visit: Payer: BLUE CROSS/BLUE SHIELD | Admitting: Sports Medicine

## 2018-04-01 DIAGNOSIS — M25872 Other specified joint disorders, left ankle and foot: Secondary | ICD-10-CM

## 2018-04-01 DIAGNOSIS — M7742 Metatarsalgia, left foot: Secondary | ICD-10-CM | POA: Diagnosis not present

## 2018-04-01 DIAGNOSIS — R269 Unspecified abnormalities of gait and mobility: Secondary | ICD-10-CM | POA: Diagnosis not present

## 2018-04-01 DIAGNOSIS — M216X2 Other acquired deformities of left foot: Secondary | ICD-10-CM | POA: Diagnosis not present

## 2018-04-01 NOTE — Progress Notes (Signed)
  James Mack, James Mack at [place]  James Mack - 61 y.o. male MRN [id number]  Date of birth: 1955/05/27  Visit Date:   PCP: Hulan Fess, MD   Referred by: Hulan Fess, MD  SUBJECTIVE:  James Mack is here for a procedure only visit for custom orthotic fabrication.  OBJECTIVE:  PHYSICAL EXAM: Please see previous exam notes for full evaluation of foot and gait exam. MSK Exam: Generalized bossing of his ankle with overpronation gait of the left foot, better on the right but still present.  ASSESSMENT   1. Ankle impingement syndrome, left   2. Gait disturbance   3. Metatarsalgia of left foot   4. Loss of transverse plantar arch of left foot       PLAN & PROCEDURES:   . Custom orthotics fabricated today as below     PROCEDURE: CUSTOM ORTHOTIC FABRICATION Patient's underlying musculoskeletal conditions are directly related to poor biomechanics and will benefit from a functional custom orthotic.  There are no significant foot deformities that complicate the use of a custom orthotic.  The patient was fitted for a standard, cushioned, semi-rigid orthotic. The orthotic was heated & placed on the orthotic stand. The patient was positioned in subtalar neutral position and 10 of ankle dorsiflexion and weight bearing stance on the heated orthotic blank. After completion of the molding a base was applied to the orthotic blank. The orthotic was ground to a stable position for weightbearing. The patient ambulated in these and reported they were comfortable without pressure spots.              BLANK:  Size Size 16 trimmed to fit for additional first ray support. - Standard Cushioned               BASE:  Blue EVA      POSTINGS:  none          Gerda Diss, [facility] Physician

## 2018-05-13 DIAGNOSIS — M5137 Other intervertebral disc degeneration, lumbosacral region: Secondary | ICD-10-CM | POA: Diagnosis not present

## 2018-05-13 DIAGNOSIS — M9903 Segmental and somatic dysfunction of lumbar region: Secondary | ICD-10-CM | POA: Diagnosis not present

## 2018-05-13 DIAGNOSIS — M9905 Segmental and somatic dysfunction of pelvic region: Secondary | ICD-10-CM | POA: Diagnosis not present

## 2018-05-13 DIAGNOSIS — M25552 Pain in left hip: Secondary | ICD-10-CM | POA: Diagnosis not present

## 2018-05-17 ENCOUNTER — Encounter: Payer: Self-pay | Admitting: Sports Medicine

## 2018-05-20 DIAGNOSIS — M5137 Other intervertebral disc degeneration, lumbosacral region: Secondary | ICD-10-CM | POA: Diagnosis not present

## 2018-05-20 DIAGNOSIS — M25552 Pain in left hip: Secondary | ICD-10-CM | POA: Diagnosis not present

## 2018-05-20 DIAGNOSIS — M9903 Segmental and somatic dysfunction of lumbar region: Secondary | ICD-10-CM | POA: Diagnosis not present

## 2018-05-20 DIAGNOSIS — M9905 Segmental and somatic dysfunction of pelvic region: Secondary | ICD-10-CM | POA: Diagnosis not present

## 2018-05-24 ENCOUNTER — Encounter: Payer: Self-pay | Admitting: Sports Medicine

## 2018-05-24 ENCOUNTER — Ambulatory Visit: Payer: BLUE CROSS/BLUE SHIELD | Admitting: Sports Medicine

## 2018-05-24 VITALS — BP 136/82 | HR 74 | Ht 72.0 in | Wt 251.6 lb

## 2018-05-24 DIAGNOSIS — M25872 Other specified joint disorders, left ankle and foot: Secondary | ICD-10-CM

## 2018-05-24 DIAGNOSIS — M216X2 Other acquired deformities of left foot: Secondary | ICD-10-CM

## 2018-05-24 DIAGNOSIS — M180 Bilateral primary osteoarthritis of first carpometacarpal joints: Secondary | ICD-10-CM

## 2018-05-24 DIAGNOSIS — M7742 Metatarsalgia, left foot: Secondary | ICD-10-CM | POA: Diagnosis not present

## 2018-05-24 DIAGNOSIS — R269 Unspecified abnormalities of gait and mobility: Secondary | ICD-10-CM

## 2018-05-24 MED ORDER — DICLOFENAC SODIUM 1 % TD GEL: TRANSDERMAL | 1 refills | Status: DC

## 2018-05-24 NOTE — Progress Notes (Signed)
James Mack. [place], [facility] [place]  James Mack - 63 y.o. male MRN [id number]  Date of birth: Aug 08, 1955  Visit Date: May 24, 2018  PCP: Hulan Fess, MD   Referred by: Hulan Fess, MD  SUBJECTIVE:  Chief Complaint  Patient presents with  . Follow-up    L ankle pain.  Celebrex 100 mg bid.  Custom orthotics made on 04/01/18    HPI: He reports feeling better however continued to have some pain directly over the fourth metatarsal head.  The left lateral foot and ankle pain is significantly improved.  He has only minimal pain at this time.  He is taking Celebrex 100 mg twice daily for this as well as other pain and discomfort.  Additionally reports pain directly over the [facility] joints and the Celebrex is been beneficial for this.  REVIEW OF SYSTEMS: No significant nighttime awakenings due to this issue. Denies fevers, chills, recent weight gain or weight loss.  No night sweats.  Pt denies any change in bowel or bladder habits, muscle weakness, numbness or falls associated with this pain.  HISTORY:  Prior history reviewed and updated per electronic medical record.  Patient Active Problem List   Diagnosis Date Noted  . Postoperative anemia due to acute blood loss 01/16/2013  . OA (osteoarthritis) of hip 01/15/2013  . Obstructive sleep apnea 01/07/2010    NPSG 12/22/09- AHI 6.2/hr CPAP 9/[facility]    . ASTHMA, CHILDHOOD 01/07/2010    Qualifier: History of  By: Tilden Dome     . Seasonal and perennial allergic rhinitis 01/07/2010    Qualifier: Diagnosis of  By: Tilden Dome      Social History   Occupational History  . Not on file  Tobacco Use  . Smoking status: Never Smoker  . Smokeless tobacco: Never Used  Substance and Sexual Activity  . Alcohol use: No  . Drug use: No  . Sexual activity: Never   Social History   Social History Narrative  . Not on file    OBJECTIVE:  VS:   HT:6' (182.9 cm)   WT:251 lb 9.6 oz (114.1 kg)  BMI:34.12    BP:136/82  HR:74bpm  TEMP: ( )  RESP:96 %   PHYSICAL EXAM: Adult male. No acute distress.  Alert and appropriate. His foot is overall well aligned and quite large.  He has a mild amount of pain directly over the fourth metatarsal head.  He has negative metatarsal squeeze test. Bilateral thumbs have mild osteoarthritic bossing.  There is pain and crepitation with axial load and circumduction of the thumbs.  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   ASSESSMENT:   1. Ankle impingement syndrome, left   2. Gait disturbance   3. Metatarsalgia of left foot   4. Loss of transverse plantar arch of left foot   5. Arthritis of carpometacarpal (CMC) joint of both thumbs     PROCEDURES:  None  PLAN:  Pertinent additional documentation may be included in corresponding procedure notes, imaging studies, problem based documentation and patient instructions.  No problem-specific Assessment & Plan notes found for this encounter.   Ultimately his fourth metatarsal head pain is secondary to transverse arch collapse.  Small metatarsal pad added to the custom insoles with improved support in this region.  He did report some benefit to this but there is still soreness.  He can try the Voltaren gel over the feet as well as for  his CMC arthritis that is present.  We did discuss injections for this can be performed at any time.  If he has any lack of improvement further diagnostic evaluation of the foot and ankle could be pursued and further injection therapy discussed.  Continue previously prescribed home exercise program.   Activity modifications and the importance of avoiding exacerbating activities (limiting pain to no more than a 4 / 10 during or following activity) recommended and discussed.  Discussed red flag symptoms that warrant earlier emergent evaluation and patient voices understanding.   Meds ordered this encounter    Medications  . diclofenac sodium (VOLTAREN) 1 % GEL    Sig: Apply topically to affected area qid    Dispense:  100 g    Refill:  1   Lab Orders  No laboratory test(s) ordered today   Imaging Orders  No imaging studies ordered today   Referral Orders  No referral(s) requested today    Return if symptoms worsen or fail to improve.          Gerda Diss, [place] [facility] Physician

## 2018-05-27 DIAGNOSIS — M5137 Other intervertebral disc degeneration, lumbosacral region: Secondary | ICD-10-CM | POA: Diagnosis not present

## 2018-05-27 DIAGNOSIS — M9905 Segmental and somatic dysfunction of pelvic region: Secondary | ICD-10-CM | POA: Diagnosis not present

## 2018-05-27 DIAGNOSIS — M9903 Segmental and somatic dysfunction of lumbar region: Secondary | ICD-10-CM | POA: Diagnosis not present

## 2018-05-27 DIAGNOSIS — M25552 Pain in left hip: Secondary | ICD-10-CM | POA: Diagnosis not present

## 2018-06-03 DIAGNOSIS — M9905 Segmental and somatic dysfunction of pelvic region: Secondary | ICD-10-CM | POA: Diagnosis not present

## 2018-06-03 DIAGNOSIS — M5137 Other intervertebral disc degeneration, lumbosacral region: Secondary | ICD-10-CM | POA: Diagnosis not present

## 2018-06-03 DIAGNOSIS — M9903 Segmental and somatic dysfunction of lumbar region: Secondary | ICD-10-CM | POA: Diagnosis not present

## 2018-06-03 DIAGNOSIS — M25552 Pain in left hip: Secondary | ICD-10-CM | POA: Diagnosis not present

## 2018-06-10 DIAGNOSIS — M9903 Segmental and somatic dysfunction of lumbar region: Secondary | ICD-10-CM | POA: Diagnosis not present

## 2018-06-10 DIAGNOSIS — M25552 Pain in left hip: Secondary | ICD-10-CM | POA: Diagnosis not present

## 2018-06-10 DIAGNOSIS — M9905 Segmental and somatic dysfunction of pelvic region: Secondary | ICD-10-CM | POA: Diagnosis not present

## 2018-06-10 DIAGNOSIS — M5137 Other intervertebral disc degeneration, lumbosacral region: Secondary | ICD-10-CM | POA: Diagnosis not present

## 2018-10-01 ENCOUNTER — Ambulatory Visit: Payer: BC Managed Care – PPO | Admitting: Pulmonary Disease

## 2018-10-01 ENCOUNTER — Other Ambulatory Visit: Payer: Self-pay

## 2018-10-01 ENCOUNTER — Encounter: Payer: Self-pay | Admitting: Pulmonary Disease

## 2018-10-01 DIAGNOSIS — G4733 Obstructive sleep apnea (adult) (pediatric): Secondary | ICD-10-CM

## 2018-10-01 NOTE — Progress Notes (Signed)
@Patient  ID: James Mack, male    DOB: Aug 28, 1955, 63 y.o.   MRN: [id number]  Chief Complaint  Patient presents with  . Follow-up    needs parts c-pap    Referring provider: Hulan Fess, MD  HPI:  63 year old male never smoker followed in our office for severe obstructive sleep apnea  Smoker/ Smoking History: Never smoker Maintenance: None Pt of: Dr. Annamaria Boots  10/01/2018  - Visit   63 year old male never smoker followed in our office for severe obstructive sleep apnea.  Patient has been maintained on CPAP therapy since 2011.  2011 sleep study in chart reveals mild sleep apnea.  Patient CPAP compliance report today shows excellent compliance.  See compliance report listed below:  08/31/2018-09/29/2018 20-30 in the last 30 days use, 25 those days greater than 4 hours, average usage 4 hours and 58 minutes, CPAP set pressure of 9, AHI 1.9  Patient has no current complaints or issues with the exception of he needs an order for new CPAP supplies.   FENO:  No results found for: NITRICOXIDE  PFT: No flowsheet data found.  Imaging: No results found.    Specialty Problems      Pulmonary Problems   ASTHMA, CHILDHOOD    Qualifier: History of  By: Tilden Dome        Obstructive sleep apnea    NPSG 12/22/09- AHI 6.2/hr CPAP 9/[facility]       Seasonal and perennial allergic rhinitis    Qualifier: Diagnosis of  By: Tilden Dome           No Known Allergies  Immunization History  Administered Date(s) Administered  . Influenza Split 12/10/2011, 01/08/2013  . Influenza-Unspecified 12/09/2017    Past Medical History:  Diagnosis Date  . Allergic rhinitis   . Arthritis    hips, fingers  . Asthma    childhood  . Cancer ([place])    skin cancer  . GERD (gastroesophageal reflux disease)    occasional  . Headache(784.0)    ocular migraines  . OSA (obstructive sleep apnea)    w/ insomnia NPSG 02/08/10-AHI 6.2 hr  . PONV (postoperative nausea and  vomiting)   . Snoring     Tobacco History: Social History   Tobacco Use  Smoking Status Never Smoker  Smokeless Tobacco Never Used   Counseling given: Yes   Continue to not smoke  Outpatient Encounter Medications as of 10/01/2018  Medication Sig  . allopurinol (ZYLOPRIM) 300 MG tablet Take 300 mg by mouth daily.   . celecoxib (CELEBREX) 100 MG capsule Take 1 capsule (100 mg total) by mouth 2 (two) times daily as needed.  . cholecalciferol (VITAMIN D) 1000 UNITS tablet Take 1,000 Units by mouth daily.  . furosemide (LASIX) 20 MG tablet Take 20 mg by mouth as needed.   Marland Kitchen ibuprofen (ADVIL,MOTRIN) 600 MG tablet Take 1 tablet (600 mg total) by mouth every 8 (eight) hours as needed for moderate pain.  . meloxicam (MOBIC) 7.5 MG tablet Take 7.5 mg by mouth daily.  . Multiple Vitamin (MULTIVITAMIN) tablet Take 1 tablet by mouth daily.  . diclofenac sodium (VOLTAREN) 1 % GEL Apply topically to affected area qid (Patient not taking: Reported on 10/01/2018)  . oseltamivir (TAMIFLU) 75 MG capsule Take 75 mg by mouth.   No facility-administered encounter medications on file as of 10/01/2018.      Review of Systems  Review of Systems  Constitutional: Negative for activity change, chills, fatigue, fever and unexpected weight  change.  HENT: Negative for postnasal drip, rhinorrhea, sinus pressure, sinus pain, sneezing and sore throat.   Eyes: Negative.   Respiratory: Negative for cough, shortness of breath and wheezing.   Cardiovascular: Negative for chest pain and palpitations.  Gastrointestinal: Negative for constipation, diarrhea, nausea and vomiting.  Endocrine: Negative.   Musculoskeletal: Negative.   Skin: Negative.   Neurological: Negative for dizziness and headaches.  Psychiatric/Behavioral: Negative.  Negative for dysphoric mood. The patient is not nervous/anxious.   All other systems reviewed and are negative.    Physical Exam  BP 130/88 (BP Location: Left Arm, Patient  Position: Sitting, Cuff Size: Normal)   Pulse 60   Temp 98.6 F (37 C)   Wt 253 lb 12.8 oz (115.1 kg)   SpO2 98%   BMI 34.42 kg/m   Wt Readings from Last 5 Encounters:  10/01/18 253 lb 12.8 oz (115.1 kg)  05/24/18 251 lb 9.6 oz (114.1 kg)  03/26/18 252 lb 3.2 oz (114.4 kg)  01/06/16 245 lb 3.2 oz (111.2 kg)  12/07/14 233 lb 12.8 oz (106.1 kg)     Physical Exam  Constitutional: He is oriented to person, place, and time and well-developed, well-nourished, and in no distress. No distress.  HENT:  Head: Normocephalic and atraumatic.  Right Ear: Hearing, tympanic membrane, external ear and ear canal normal.  Left Ear: Hearing, tympanic membrane, external ear and ear canal normal.  Nose: Nose normal. Right sinus exhibits no maxillary sinus tenderness and no frontal sinus tenderness. Left sinus exhibits no maxillary sinus tenderness and no frontal sinus tenderness.  Mouth/Throat: Uvula is midline and oropharynx is clear and moist. No oropharyngeal exudate.  Eyes: Pupils are equal, round, and reactive to light.  Neck: Normal range of motion. Neck supple.  Cardiovascular: Normal rate, regular rhythm and normal heart sounds.  Pulmonary/Chest: Effort normal. No accessory muscle usage. No respiratory distress. He has no decreased breath sounds. He has no wheezes. He has no rhonchi.  Abdominal: Soft. Bowel sounds are normal. There is no abdominal tenderness.  Musculoskeletal: Normal range of motion.        General: No edema.  Lymphadenopathy:    He has no cervical adenopathy.  Neurological: He is alert and oriented to person, place, and time. Gait normal.  Skin: Skin is warm and dry. He is not diaphoretic. No erythema.  Psychiatric: Mood, memory, affect and judgment normal.  Nursing note and vitals reviewed.     Lab Results:  CBC    Component Value Date/Time   WBC 10.0 01/17/2013 0432   RBC 3.70 (L) 01/17/2013 0432   HGB 11.2 (L) 01/17/2013 0432   HCT 32.6 (L) 01/17/2013 0432    PLT 188 01/17/2013 0432   MCV 88.1 01/17/2013 0432   MCH 30.3 01/17/2013 0432   MCHC 34.4 01/17/2013 0432   RDW 12.8 01/17/2013 0432    BMET    Component Value Date/Time   NA 139 01/17/2013 0432   K 4.2 01/17/2013 0432   CL 104 01/17/2013 0432   CO2 30 01/17/2013 0432   GLUCOSE 136 (H) 01/17/2013 0432   BUN 13 01/17/2013 0432   CREATININE 0.91 01/17/2013 0432   CALCIUM 8.7 01/17/2013 0432   GFRNONAA >90 01/17/2013 0432   GFRAA >90 01/17/2013 0432    BNP No results found for: BNP  ProBNP No results found for: PROBNP    Assessment & Plan:   Obstructive sleep apnea Assessment: 2011 sleep study shows an AHI of 6.2 CPAP compliance report shows excellent  compliance  Plan: Continue CPAP Follow-up in 1 year    Return in about 1 year (around 10/01/2019), or if symptoms worsen or fail to improve, for Follow up with Wyn Quaker FNP-C, Follow up with Dr. Annamaria Boots.   Lauraine Rinne, NP 10/01/2018   This appointment was 16 minutes long with over 50% of the time in direct face-to-face patient care, assessment, plan of care, and follow-up.

## 2018-10-01 NOTE — Assessment & Plan Note (Signed)
Assessment: 2011 sleep study shows an AHI of 6.2 CPAP compliance report shows excellent compliance  Plan: Continue CPAP Follow-up in 1 year

## 2018-10-01 NOTE — Patient Instructions (Addendum)
Refill supplies for CPAP for a year   We recommend that you continue using your CPAP daily >>>Keep up the hard work using your device >>> Goal should be wearing this for the entire night that you are sleeping, at least 4 to 6 hours  Remember:  . Do not drive or operate heavy machinery if tired or drowsy.  . Please notify the supply company and office if you are unable to use your device regularly due to missing supplies or machine being broken.  . Work on maintaining a healthy weight and following your recommended nutrition plan  . Maintain proper daily exercise and movement  . Maintaining proper use of your device can also help improve management of other chronic illnesses such as: Blood pressure, blood sugars, and weight management.   BiPAP/ CPAP Cleaning:  >>>Clean weekly, with Dawn soap, and bottle brush.  Set up to air dry.   Return in about 1 year (around 10/01/2019), or if symptoms worsen or fail to improve, for Follow up with Wyn Quaker FNP-C, Follow up with Dr. Annamaria Boots.   Coronavirus (COVID-19) Are you at risk?  Are you at risk for the Coronavirus (COVID-19)?  To be considered HIGH RISK for Coronavirus (COVID-19), you have to meet the following criteria:  . Traveled to [place], [place], [place], [place] or [place]; or in the [place] to [place], [place], [place], or [place]; and have fever, cough, and shortness of breath within the last 2 weeks of travel OR . Been in close contact with a person diagnosed with COVID-19 within the last 2 weeks and have fever, cough, and shortness of breath . IF YOU DO NOT MEET THESE CRITERIA, YOU ARE CONSIDERED LOW RISK FOR COVID-19.  What to do if you are HIGH RISK for COVID-19?  Marland Kitchen If you are having a medical emergency, call 911. . Seek medical care right away. Before you go to a doctor's office, urgent care or emergency department, call ahead and tell them about your recent travel, contact with someone diagnosed with COVID-19, and  your symptoms. You should receive instructions from your physician's office regarding next steps of care.  . When you arrive at healthcare provider, tell the healthcare staff immediately you have returned from visiting [place], [place], [place], [place] or [place]; or traveled in the [place] to [place], [place], [place], or [place]; in the last two weeks or you have been in close contact with a person diagnosed with COVID-19 in the last 2 weeks.   . Tell the health care staff about your symptoms: fever, cough and shortness of breath. . After you have been seen by a medical provider, you will be either: o Tested for (COVID-19) and discharged home on quarantine except to seek medical care if symptoms worsen, and asked to  - Stay home and avoid contact with others until you get your results (4-5 days)  - Avoid travel on public transportation if possible (such as bus, train, or airplane) or o Sent to the Emergency Department by EMS for evaluation, COVID-19 testing, and possible admission depending on your condition and test results.  What to do if you are LOW RISK for COVID-19?  Reduce your risk of any infection by using the same precautions used for avoiding the common cold or flu:  Marland Kitchen Wash your hands often with soap and warm water for at least 20 seconds.  If soap and water are not readily available, use an alcohol-based hand sanitizer with at  least 60% alcohol.  . If coughing or sneezing, cover your mouth and nose by coughing or sneezing into the elbow areas of your shirt or coat, into a tissue or into your sleeve (not your hands). . Avoid shaking hands with others and consider head nods or verbal greetings only. . Avoid touching your eyes, nose, or mouth with unwashed hands.  . Avoid close contact with people who are sick. . Avoid places or events with large numbers of people in one location, like concerts or sporting events. . Carefully consider travel plans you have or are  making. . If you are planning any travel outside or inside the [place], visit the CDC's Travelers' Health webpage for the latest health notices. . If you have some symptoms but not all symptoms, continue to monitor at home and seek medical attention if your symptoms worsen. . If you are having a medical emergency, call 911.   [place] / e-Visit: [email or web address]         [facility]: [facility]: [phone]                   [facility]: [phone]           It is flu season:   >>> Best ways to protect herself from the flu: Receive the yearly flu vaccine, practice good hand hygiene washing with soap and also using hand sanitizer when available, eat a nutritious meals, get adequate rest, hydrate appropriately   Please contact the office if your symptoms worsen or you have concerns that you are not improving.   Thank you for choosing [facility] for your healthcare, and for allowing [place] to partner with you on your healthcare journey. I am thankful to be able to provide care to you today.   Wyn Quaker FNP-C

## 2018-10-24 ENCOUNTER — Telehealth: Payer: Self-pay | Admitting: Pulmonary Disease

## 2018-10-24 NOTE — Telephone Encounter (Signed)
Order has been reprinted and faxed

## 2018-10-24 NOTE — Telephone Encounter (Signed)
Order for cpap supplies was placed on 6/23 at office visit, but order was not received by DME [facility].  Pt called their office to follow up on order and was told that no order was on file for him.  PCCs please advise if this 6/23 order can be refaxed to DME.  Thanks!

## 2018-10-28 DIAGNOSIS — M25552 Pain in left hip: Secondary | ICD-10-CM | POA: Diagnosis not present

## 2018-10-28 DIAGNOSIS — G4733 Obstructive sleep apnea (adult) (pediatric): Secondary | ICD-10-CM | POA: Diagnosis not present

## 2018-10-28 DIAGNOSIS — M9903 Segmental and somatic dysfunction of lumbar region: Secondary | ICD-10-CM | POA: Diagnosis not present

## 2018-10-28 DIAGNOSIS — M9905 Segmental and somatic dysfunction of pelvic region: Secondary | ICD-10-CM | POA: Diagnosis not present

## 2018-10-28 DIAGNOSIS — M5137 Other intervertebral disc degeneration, lumbosacral region: Secondary | ICD-10-CM | POA: Diagnosis not present

## 2018-11-04 DIAGNOSIS — M9903 Segmental and somatic dysfunction of lumbar region: Secondary | ICD-10-CM | POA: Diagnosis not present

## 2018-11-04 DIAGNOSIS — M25552 Pain in left hip: Secondary | ICD-10-CM | POA: Diagnosis not present

## 2018-11-04 DIAGNOSIS — M5137 Other intervertebral disc degeneration, lumbosacral region: Secondary | ICD-10-CM | POA: Diagnosis not present

## 2018-11-04 DIAGNOSIS — M9905 Segmental and somatic dysfunction of pelvic region: Secondary | ICD-10-CM | POA: Diagnosis not present

## 2018-11-11 DIAGNOSIS — M25552 Pain in left hip: Secondary | ICD-10-CM | POA: Diagnosis not present

## 2018-11-11 DIAGNOSIS — M9903 Segmental and somatic dysfunction of lumbar region: Secondary | ICD-10-CM | POA: Diagnosis not present

## 2018-11-11 DIAGNOSIS — M9905 Segmental and somatic dysfunction of pelvic region: Secondary | ICD-10-CM | POA: Diagnosis not present

## 2018-11-11 DIAGNOSIS — M5137 Other intervertebral disc degeneration, lumbosacral region: Secondary | ICD-10-CM | POA: Diagnosis not present

## 2018-11-18 DIAGNOSIS — M9903 Segmental and somatic dysfunction of lumbar region: Secondary | ICD-10-CM | POA: Diagnosis not present

## 2018-11-18 DIAGNOSIS — M25552 Pain in left hip: Secondary | ICD-10-CM | POA: Diagnosis not present

## 2018-11-18 DIAGNOSIS — M5137 Other intervertebral disc degeneration, lumbosacral region: Secondary | ICD-10-CM | POA: Diagnosis not present

## 2018-11-18 DIAGNOSIS — M9905 Segmental and somatic dysfunction of pelvic region: Secondary | ICD-10-CM | POA: Diagnosis not present

## 2018-11-25 DIAGNOSIS — M9905 Segmental and somatic dysfunction of pelvic region: Secondary | ICD-10-CM | POA: Diagnosis not present

## 2018-11-25 DIAGNOSIS — M25552 Pain in left hip: Secondary | ICD-10-CM | POA: Diagnosis not present

## 2018-11-25 DIAGNOSIS — M5137 Other intervertebral disc degeneration, lumbosacral region: Secondary | ICD-10-CM | POA: Diagnosis not present

## 2018-11-25 DIAGNOSIS — M9903 Segmental and somatic dysfunction of lumbar region: Secondary | ICD-10-CM | POA: Diagnosis not present

## 2018-12-17 DIAGNOSIS — M25552 Pain in left hip: Secondary | ICD-10-CM | POA: Diagnosis not present

## 2018-12-17 DIAGNOSIS — M9903 Segmental and somatic dysfunction of lumbar region: Secondary | ICD-10-CM | POA: Diagnosis not present

## 2018-12-17 DIAGNOSIS — M5137 Other intervertebral disc degeneration, lumbosacral region: Secondary | ICD-10-CM | POA: Diagnosis not present

## 2018-12-17 DIAGNOSIS — M9905 Segmental and somatic dysfunction of pelvic region: Secondary | ICD-10-CM | POA: Diagnosis not present

## 2018-12-23 DIAGNOSIS — M9905 Segmental and somatic dysfunction of pelvic region: Secondary | ICD-10-CM | POA: Diagnosis not present

## 2018-12-23 DIAGNOSIS — M5137 Other intervertebral disc degeneration, lumbosacral region: Secondary | ICD-10-CM | POA: Diagnosis not present

## 2018-12-23 DIAGNOSIS — M25552 Pain in left hip: Secondary | ICD-10-CM | POA: Diagnosis not present

## 2018-12-23 DIAGNOSIS — M9903 Segmental and somatic dysfunction of lumbar region: Secondary | ICD-10-CM | POA: Diagnosis not present

## 2018-12-24 DIAGNOSIS — G8929 Other chronic pain: Secondary | ICD-10-CM | POA: Diagnosis not present

## 2018-12-24 DIAGNOSIS — Z9889 Other specified postprocedural states: Secondary | ICD-10-CM | POA: Diagnosis not present

## 2018-12-24 DIAGNOSIS — M25512 Pain in left shoulder: Secondary | ICD-10-CM | POA: Diagnosis not present

## 2018-12-30 DIAGNOSIS — M5137 Other intervertebral disc degeneration, lumbosacral region: Secondary | ICD-10-CM | POA: Diagnosis not present

## 2018-12-30 DIAGNOSIS — M9903 Segmental and somatic dysfunction of lumbar region: Secondary | ICD-10-CM | POA: Diagnosis not present

## 2018-12-30 DIAGNOSIS — M25552 Pain in left hip: Secondary | ICD-10-CM | POA: Diagnosis not present

## 2018-12-30 DIAGNOSIS — M9905 Segmental and somatic dysfunction of pelvic region: Secondary | ICD-10-CM | POA: Diagnosis not present

## 2019-01-22 DIAGNOSIS — M19012 Primary osteoarthritis, left shoulder: Secondary | ICD-10-CM | POA: Diagnosis not present

## 2019-01-31 DIAGNOSIS — H524 Presbyopia: Secondary | ICD-10-CM | POA: Diagnosis not present

## 2019-01-31 DIAGNOSIS — H43813 Vitreous degeneration, bilateral: Secondary | ICD-10-CM | POA: Diagnosis not present

## 2019-01-31 DIAGNOSIS — H2513 Age-related nuclear cataract, bilateral: Secondary | ICD-10-CM | POA: Diagnosis not present

## 2019-01-31 DIAGNOSIS — H353132 Nonexudative age-related macular degeneration, bilateral, intermediate dry stage: Secondary | ICD-10-CM | POA: Diagnosis not present

## 2019-02-07 ENCOUNTER — Other Ambulatory Visit: Payer: Self-pay

## 2019-02-07 DIAGNOSIS — Z20822 Contact with and (suspected) exposure to covid-19: Secondary | ICD-10-CM

## 2019-02-09 LAB — NOVEL CORONAVIRUS, NAA: SARS-CoV-2, NAA: NOT DETECTED

## 2019-05-30 ENCOUNTER — Ambulatory Visit: Payer: BC Managed Care – PPO

## 2019-09-24 DIAGNOSIS — M25552 Pain in left hip: Secondary | ICD-10-CM | POA: Diagnosis not present

## 2019-09-24 DIAGNOSIS — M50322 Other cervical disc degeneration at C5-C6 level: Secondary | ICD-10-CM | POA: Diagnosis not present

## 2019-09-24 DIAGNOSIS — M9905 Segmental and somatic dysfunction of pelvic region: Secondary | ICD-10-CM | POA: Diagnosis not present

## 2019-09-24 DIAGNOSIS — M9901 Segmental and somatic dysfunction of cervical region: Secondary | ICD-10-CM | POA: Diagnosis not present

## 2019-09-29 DIAGNOSIS — M9901 Segmental and somatic dysfunction of cervical region: Secondary | ICD-10-CM | POA: Diagnosis not present

## 2019-09-29 DIAGNOSIS — M9905 Segmental and somatic dysfunction of pelvic region: Secondary | ICD-10-CM | POA: Diagnosis not present

## 2019-09-29 DIAGNOSIS — M50322 Other cervical disc degeneration at C5-C6 level: Secondary | ICD-10-CM | POA: Diagnosis not present

## 2019-09-29 DIAGNOSIS — M25552 Pain in left hip: Secondary | ICD-10-CM | POA: Diagnosis not present

## 2019-09-30 ENCOUNTER — Other Ambulatory Visit: Payer: Self-pay | Admitting: Family Medicine

## 2019-09-30 DIAGNOSIS — R079 Chest pain, unspecified: Secondary | ICD-10-CM

## 2019-09-30 DIAGNOSIS — R600 Localized edema: Secondary | ICD-10-CM

## 2019-09-30 NOTE — Progress Notes (Signed)
Pt here for evaluation of CP. Current episode may have started this morning or been a continuation from CP that started last night. Substernal and slightly to the L. Non-radiating. Pressure, achy, and sharp. Similar episodes which started 2 wks ago. Intermittent. Unsure of duration of episodes. Comes on at random times. Non-exertional. No associated sx such as n/v, SOB, palpitations, radiation of pain, fatigue, LOC. States that his LE edema has worsened lately (on L). Nothing he does seems to make it go away. If he takes a deep breath he can sometimes worsen the pain. Exercises at the gym 2x weekly on the eliptical w/o any loss in stamina or occurances of the CP. No prior cardiac workup. Of note pt states when he went to the chiropractor he was told his BP was 160/100s. This was not associated w/ any symptoms. Rechecked multiple times which showed similar but slightly better numbers. When pt arrived home he checked his BP and it was 130/80s.

## 2019-10-01 ENCOUNTER — Other Ambulatory Visit (HOSPITAL_COMMUNITY): Payer: Self-pay | Admitting: Cardiology

## 2019-10-02 ENCOUNTER — Other Ambulatory Visit: Payer: Self-pay

## 2019-10-02 ENCOUNTER — Ambulatory Visit: Payer: BC Managed Care – PPO | Admitting: Internal Medicine

## 2019-10-02 ENCOUNTER — Encounter: Payer: Self-pay | Admitting: Internal Medicine

## 2019-10-02 VITALS — BP 142/88 | HR 58 | Ht 72.0 in | Wt 251.8 lb

## 2019-10-02 DIAGNOSIS — R079 Chest pain, unspecified: Secondary | ICD-10-CM | POA: Diagnosis not present

## 2019-10-02 DIAGNOSIS — G4733 Obstructive sleep apnea (adult) (pediatric): Secondary | ICD-10-CM | POA: Diagnosis not present

## 2019-10-02 DIAGNOSIS — M9901 Segmental and somatic dysfunction of cervical region: Secondary | ICD-10-CM | POA: Diagnosis not present

## 2019-10-02 DIAGNOSIS — M9905 Segmental and somatic dysfunction of pelvic region: Secondary | ICD-10-CM | POA: Diagnosis not present

## 2019-10-02 DIAGNOSIS — M25552 Pain in left hip: Secondary | ICD-10-CM | POA: Diagnosis not present

## 2019-10-02 DIAGNOSIS — M50322 Other cervical disc degeneration at C5-C6 level: Secondary | ICD-10-CM | POA: Diagnosis not present

## 2019-10-02 MED ORDER — METOPROLOL TARTRATE 50 MG PO TABS: 100.0000 mg | ORAL_TABLET | Freq: Once | ORAL | 0 refills | Status: DC

## 2019-10-02 NOTE — Progress Notes (Signed)
Cardiology Office Note:    Date:  10/02/2019   ID:  James Mack, DOB 12-May-1955, MRN [id number]  PCP:  Hulan Fess, MD  Cardiologist:  Elouise Munroe, MD  Electrophysiologist:  None   Referring MD: Hulan Fess, MD / Linna Darner MD  Chief Complaint:  Chest pain   Patient Profile/HPI:   James Mack is a 64 y.o. male with OSA who presents for evaluation of chest pain.   Chest pain is left of substernal and started at around 8am. This is nitro responsive chest pain, however he felt his "head was going to explode" after taking nitro. He exercised with no worsening of chest pain. Chest pain is substernal and left of substernal, lasting minutes at a time, with time relieving the pain. No known aggravating factor. Started a few weeks ago. Positional change makes it worse. No radiation. He has been told previously that his BP is elevated but is not associated with CP.  The patient denies dyspnea at rest or with exertion, palpitations, PND, orthopnea, or leg swelling. Denies cough, fever, chills. Denies nausea, vomiting. Denies syncope or presyncope. Denies dizziness or lightheadedness.   FHx: Father with MI  Past Medical History:  Diagnosis Date  . Allergic rhinitis   . Arthritis    hips, fingers  . Asthma    childhood  . Cancer ([place])    skin cancer  . GERD (gastroesophageal reflux disease)    occasional  . Headache(784.0)    ocular migraines  . OSA (obstructive sleep apnea)    w/ insomnia NPSG 02/08/10-AHI 6.2 hr  . PONV (postoperative nausea and vomiting)   . Snoring     Current Medications: Current Meds  Medication Sig  . allopurinol (ZYLOPRIM) 300 MG tablet Take 300 mg by mouth daily.   Marland Kitchen aspirin EC 81 MG tablet Take 81 mg by mouth daily. Swallow whole.  . cholecalciferol (VITAMIN D) 1000 UNITS tablet Take 1,000 Units by mouth daily.  . furosemide (LASIX) 20 MG tablet Take 20 mg by mouth as needed.   Marland Kitchen ibuprofen (ADVIL,MOTRIN) 600 MG tablet Take 1 tablet  (600 mg total) by mouth every 8 (eight) hours as needed for moderate pain.  . meloxicam (MOBIC) 7.5 MG tablet Take 7.5 mg by mouth daily.  . Multiple Vitamin (MULTIVITAMIN) tablet Take 1 tablet by mouth daily.  Marland Kitchen terbinafine (LAMISIL) 250 MG tablet Take 250 mg by mouth daily.  . [DISCONTINUED] oseltamivir (TAMIFLU) 75 MG capsule Take 75 mg by mouth.     Allergies:   Patient has no known allergies.   Social History   Tobacco Use  . Smoking status: Never Smoker  . Smokeless tobacco: Never Used  Substance Use Topics  . Alcohol use: No  . Drug use: No     Family Hx: The patient's family history includes Breast cancer in his mother; Cancer in his sister; Sleep apnea in his sister.  ROS   EKGs/Labs/Other Test Reviewed:    EKG:  Sinus bradycardia, rate 58.  Recent Labs: No results found for requested labs within last 8760 hours.   Recent Lipid Panel No results found for: CHOL, TRIG, HDL, CHOLHDL, LDLCALC, LDLDIRECT  Physical Exam:    VS:  BP (!) 142/88   Pulse (!) 58   Ht 6' (1.829 m)   Wt 251 lb 12.8 oz (114.2 kg)   SpO2 98%   BMI 34.15 kg/m     Wt Readings from Last 3 Encounters:  10/02/19 251 lb 12.8 oz (114.2  kg)  10/01/18 253 lb 12.8 oz (115.1 kg)  05/24/18 251 lb 9.6 oz (114.1 kg)    Constitutional: No acute distress Eyes: sclera non-icteric, normal conjunctiva and lids ENMT: normal dentition, moist mucous membranes Cardiovascular: regular rhythm, normal rate, no murmurs. S1 and S2 normal. Radial pulses normal bilaterally. No jugular venous distention.  Respiratory: clear to auscultation bilaterally GI : normal bowel sounds, soft and nontender. No distention.   MSK: extremities warm, well perfused. No edema.  NEURO: grossly nonfocal exam, moves all extremities. PSYCH: alert and oriented x 3, normal mood and affect.    ASSESSMENT & PLAN:    Chest pain, unspecified type - Plan: EKG 12-Lead, C-reactive protein, Sedimentation rate, CT CORONARY MORPH W/CTA COR  W/SCORE W/CA W/CM &/OR WO/CM, CT CORONARY FRACTIONAL FLOW RESERVE DATA PREP, CT CORONARY FRACTIONAL FLOW RESERVE FLUID ANALYSIS  Obstructive sleep apnea  He has some typical and atypical features to chest pain. Echocardiogram has already been ordered, and will help to determine if chest pain is due to pericardial disease given elements related to position change. Will also evaluate structure and function.   We will perform an ischemic evaluation with CCTA to define coronary anatomy given substernal chest pain.   Will monitor elevated blood pressure, if elevated at follow up will consider medication therapy for HTN.   Medication Adjustments/Labs and Tests Ordered: Current medicines are reviewed at length with the patient today.  Concerns regarding medicines are outlined above.  Tests Ordered: Orders Placed This Encounter  Procedures  . CT CORONARY MORPH W/CTA COR W/SCORE W/CA W/CM &/OR WO/CM  . CT CORONARY FRACTIONAL FLOW RESERVE DATA PREP  . CT CORONARY FRACTIONAL FLOW RESERVE FLUID ANALYSIS  . C-reactive protein  . Sedimentation rate  . EKG 12-Lead   Medication Changes: Meds ordered this encounter  Medications  . metoprolol tartrate (LOPRESSOR) 50 MG tablet    Sig: Take 2 tablets (100 mg total) by mouth once for 1 dose. TAKE TWO HOURS PRIOR TO  SCHEDULE CARDIAC TEST    Dispense:  2 tablet    Refill:  0    Signed, Elouise Munroe, MD  10/02/2019 1:30 PM

## 2019-10-02 NOTE — Patient Instructions (Signed)
Medication Instructions:  See instruction for CCTA  Metoprolol tartrate 100 mg one dose for CCTA *If you need a refill on your cardiac medications before your next appointment, please call your pharmacy*   Lab Work: Sed rate  CRP If you have labs (blood work) drawn today and your tests are completely normal, you will receive your results only by: Marland Kitchen MyChart Message (if you have MyChart) OR . A paper copy in the mail If you have any lab test that is abnormal or we need to change your treatment, we will call you to review the results.   Testing/Procedures:   Coronary CTA will be schedule at  [facility] hospital at radiology  Once insurance authorization is obtained   Follow-Up: At [facility], you and your health needs are our priority.  As part of our continuing mission to provide you with exceptional heart care, we have created designated Provider Care Teams.  These Care Teams include your primary Cardiologist (physician) and Advanced Practice Providers (APPs -  Physician Assistants and Nurse Practitioners) who all work together to provide you with the care you need, when you need it.  We recommend signing up for the patient portal called "MyChart".  Sign up information is provided on this After Visit Summary.  MyChart is used to connect with patients for Virtual Visits (Telemedicine).  Patients are able to view lab/test results, encounter notes, upcoming appointments, etc.  Non-urgent messages can be sent to your provider as well.   To learn more about what you can do with MyChart, go to [email or web address].    Your next appointment:   2 month(s)  The format for your next appointment:   In Person  Provider:   Cherlynn Kaiser, MD   Other Instructions    Your cardiac CT will be scheduled at  the below locations:   [facility] [address] [place], [place] [address] [phone]    If scheduled at [facility], please arrive at the [facility] main  entrance of [facility] 30 minutes prior to test start time. Proceed to the [facility] Radiology Department (first floor) to check-in and test prep.    Please follow these instructions carefully (unless otherwise directed):   you will not need Labs BMP -- ( results from 09/30/19 are in the system)   On the Night Before the Test: . Be sure to Drink plenty of water. . Do not consume any caffeinated/decaffeinated beverages or chocolate 12 hours prior to your test. . Do not take any antihistamines 12 hours prior to your test. .   On the Day of the Test: . Drink plenty of water. Do not drink any water within one hour of the test. . Do not eat any food 4 hours prior to the test. . You may take your regular medications prior to the test.  . Take metoprolol (Lopressor) 100 mg  two hours prior to test. . HOLD Furosemide/Hydrochlorothiazide morning of the test.    .       After the Test: . Drink plenty of water. . After receiving IV contrast, you may experience a mild flushed feeling. This is normal. . On occasion, you may experience a mild rash up to 24 hours after the test. This is not dangerous. If this occurs, you can take Benadryl 25 mg and increase your fluid intake. . If you experience trouble breathing, this can be serious. If it is severe call 911 IMMEDIATELY. If it is mild, please call  our office. . If you take any of these medications: Glipizide/Metformin, Avandament, Glucavance, please do not take 48 hours after completing test unless otherwise instructed.   Once we have confirmed authorization from your insurance company, we will call you to set up a date and time for your test.   For non-scheduling related questions, please contact the cardiac imaging nurse navigator should you have any questions/concerns: Marchia Bond, Cardiac Imaging Nurse Navigator Burley Saver, Interim Cardiac Imaging Nurse [place] [facility] Direct Office Dial:  [phone]   For scheduling needs, including cancellations and rescheduling, please call [phone].

## 2019-10-03 ENCOUNTER — Ambulatory Visit: Payer: BC Managed Care – PPO | Admitting: Pulmonary Disease

## 2019-10-03 ENCOUNTER — Telehealth: Payer: Self-pay | Admitting: *Deleted

## 2019-10-03 ENCOUNTER — Encounter: Payer: Self-pay | Admitting: Pulmonary Disease

## 2019-10-03 VITALS — BP 124/76 | HR 86 | Temp 98.7°F | Ht 72.0 in | Wt 251.4 lb

## 2019-10-03 DIAGNOSIS — G4733 Obstructive sleep apnea (adult) (pediatric): Secondary | ICD-10-CM | POA: Diagnosis not present

## 2019-10-03 LAB — C-REACTIVE PROTEIN: CRP: 2 mg/L (ref 0–10)

## 2019-10-03 LAB — SEDIMENTATION RATE: Sed Rate: 2 mm/hr (ref 0–30)

## 2019-10-03 NOTE — Patient Instructions (Addendum)
You were seen today by Lauraine Rinne, NP  for:   [facility] seeing you in office today.  Glad things are going well.  Let us know when you are able to download the Fitbit data so we can compare to the CPAP compliance report.  Given the discrepancy of the average usage of the days used.  Keep up the hard work.  Take care of yourself and stay safe,  We will see you back in 1 year or sooner if you are having any issues  Aaron Edelman  1. Obstructive sleep apnea  We recommend that you continue using your CPAP daily >>>Keep up the hard work using your device >>> Goal should be wearing this for the entire night that you are sleeping, at least 4 to 6 hours  Remember:  . Do not drive or operate heavy machinery if tired or drowsy.  . Please notify the supply company and office if you are unable to use your device regularly due to missing supplies or machine being broken.  . Work on maintaining a healthy weight and following your recommended nutrition plan  . Maintain proper daily exercise and movement  . Maintaining proper use of your device can also help improve management of other chronic illnesses such as: Blood pressure, blood sugars, and weight management.   BiPAP/ CPAP Cleaning:  >>>Clean weekly, with Dawn soap, and bottle brush.  Set up to air dry. >>> Wipe mask out daily with wet wipe or towelette    Follow Up:    Return in about 1 year (around 10/02/2020), or if symptoms worsen or fail to improve, for Follow up with Dr. Annamaria Boots.   Please do your part to reduce the spread of COVID-19:      Reduce your risk of any infection  and COVID19 by using the similar precautions used for avoiding the common cold or flu:  Marland Kitchen Wash your hands often with soap and warm water for at least 20 seconds.  If soap and water are not readily available, use an alcohol-based hand sanitizer with at least 60% alcohol.  . If coughing or sneezing, cover your mouth and nose by coughing or sneezing into the elbow areas of  your shirt or coat, into a tissue or into your sleeve (not your hands). Langley Gauss A MASK when in public  . Avoid shaking hands with others and consider head nods or verbal greetings only. . Avoid touching your eyes, nose, or mouth with unwashed hands.  . Avoid close contact with people who are sick. . Avoid places or events with large numbers of people in one location, like concerts or sporting events. . If you have some symptoms but not all symptoms, continue to monitor at home and seek medical attention if your symptoms worsen. . If you are having a medical emergency, call 911.   [place] / e-Visit: [email or web address]         [facility]: [facility]: [phone]                   [facility]: [phone]     It is flu season:   >>> Best ways to protect herself from the flu: Receive the yearly flu vaccine, practice good hand hygiene washing with soap and also using hand sanitizer when available, eat a nutritious meals, get adequate rest, hydrate appropriately   Please contact the office if your symptoms worsen or  you have concerns that you are not improving.   Thank you for choosing [facility] for your healthcare, and for allowing [place] to partner with you on your healthcare journey. I am thankful to be able to provide care to you today.   Wyn Quaker FNP-C

## 2019-10-03 NOTE — Assessment & Plan Note (Signed)
Plan: Continue CPAP therapy Follow-up in 1 year 

## 2019-10-03 NOTE — Progress Notes (Signed)
@Patient  ID: James Mack, male    DOB: [birth year], 64 y.o.   MRN: [id number]  Chief Complaint  Patient presents with  . Follow-up    pt is here for cpap compliance    Referring provider: Hulan Fess, MD  HPI:  64 year old male never smoker followed in our office for obstructive sleep apnea  PMH: Osteoarthritis Smoker/ Smoking History: Never smoker Maintenance: None Pt of: Dr. Annamaria Boots  10/03/2019  - Visit   64 year old male never smoker followed in our office for mild obstructive sleep apnea.  Patient presenting today as a 1 year follow-up.  Overall patient feels that he is doing well.  Sleep is stable.  He actually reports that since being diagnosed with obstructive sleep apnea and started CPAP therapy he is sleeping better.  He reports that he is sleeping about 5 hours a night.  This is confirmed via his Fitbit data that he was able to show me on his phone.  CPAP compliance report listed below:  09/02/2019-10/01/2019-CPAP compliance report-30 out of last 30 days used, 28 of those days greater than 4 hours, average usage 5 hours and 2 minutes, CPAP set pressure of 9, AHI 1.1  Patient reports that he feels that his sleep is actually improved since starting CPAP therapy.  Overall has no complaints.   Questionaires / Pulmonary Flowsheets:   ACT:  No flowsheet data found.  MMRC: No flowsheet data found.  Epworth:  Results of the Epworth flowsheet 10/01/2018  Sitting and reading 0  Watching TV 1  Sitting, inactive in a public place (e.g. a theatre or a meeting) 0  As a passenger in a car for an hour without a break 0  Lying down to rest in the afternoon when circumstances permit 0  Sitting and talking to someone 0  Sitting quietly after a lunch without alcohol 3  In a car, while stopped for a few minutes in traffic 0  Total score 4    Tests:   02/08/2010-sleep study-mild obstructive sleep apnea with an AHI of 6.2  FENO:  No results found for:  NITRICOXIDE  PFT: No flowsheet data found.  WALK:  No flowsheet data found.  Imaging: No results found.  Lab Results:  CBC    Component Value Date/Time   WBC 10.0 01/17/2013 0432   RBC 3.70 (L) 01/17/2013 0432   HGB 11.2 (L) 01/17/2013 0432   HCT 32.6 (L) 01/17/2013 0432   PLT 188 01/17/2013 0432   MCV 88.1 01/17/2013 0432   MCH 30.3 01/17/2013 0432   MCHC 34.4 01/17/2013 0432   RDW 12.8 01/17/2013 0432    BMET    Component Value Date/Time   NA 139 01/17/2013 0432   K 4.2 01/17/2013 0432   CL 104 01/17/2013 0432   CO2 30 01/17/2013 0432   GLUCOSE 136 (H) 01/17/2013 0432   BUN 13 01/17/2013 0432   CREATININE 0.91 01/17/2013 0432   CALCIUM 8.7 01/17/2013 0432   GFRNONAA >90 01/17/2013 0432   GFRAA >90 01/17/2013 0432    BNP No results found for: BNP  ProBNP No results found for: PROBNP  Specialty Problems      Pulmonary Problems   ASTHMA, CHILDHOOD    Qualifier: History of  By: Tilden Dome        Obstructive sleep apnea    NPSG 12/22/09- AHI 6.2/hr CPAP 9/[facility]       Seasonal and perennial allergic rhinitis    Qualifier: Diagnosis of  By: Andee Lineman  Magda Paganini           No Known Allergies  Immunization History  Administered Date(s) Administered  . Influenza Split 12/10/2011, 01/08/2013  . Influenza-Unspecified 12/09/2017, 12/31/2018  . PFIZER SARS-COV-2 Vaccination 06/21/2019, 07/12/2019    Past Medical History:  Diagnosis Date  . Allergic rhinitis   . Arthritis    hips, fingers  . Asthma    childhood  . Cancer ([place])    skin cancer  . GERD (gastroesophageal reflux disease)    occasional  . Headache(784.0)    ocular migraines  . OSA (obstructive sleep apnea)    w/ insomnia NPSG 02/08/10-AHI 6.2 hr  . PONV (postoperative nausea and vomiting)   . Snoring     Tobacco History: Social History   Tobacco Use  Smoking Status Never Smoker  Smokeless Tobacco Never Used   Counseling given: Yes   Continue to not  smoke  Outpatient Encounter Medications as of 10/03/2019  Medication Sig  . allopurinol (ZYLOPRIM) 300 MG tablet Take 300 mg by mouth daily.   Marland Kitchen aspirin EC 81 MG tablet Take 81 mg by mouth daily. Swallow whole.  . cholecalciferol (VITAMIN D) 1000 UNITS tablet Take 1,000 Units by mouth daily.  . furosemide (LASIX) 20 MG tablet Take 20 mg by mouth as needed.   Marland Kitchen ibuprofen (ADVIL,MOTRIN) 600 MG tablet Take 1 tablet (600 mg total) by mouth every 8 (eight) hours as needed for moderate pain.  . meloxicam (MOBIC) 7.5 MG tablet Take 7.5 mg by mouth daily.  . Multiple Vitamin (MULTIVITAMIN) tablet Take 1 tablet by mouth daily.  Marland Kitchen terbinafine (LAMISIL) 250 MG tablet Take 250 mg by mouth daily.  . metoprolol tartrate (LOPRESSOR) 50 MG tablet Take 2 tablets (100 mg total) by mouth once for 1 dose. TAKE TWO HOURS PRIOR TO  SCHEDULE CARDIAC TEST   No facility-administered encounter medications on file as of 10/03/2019.     Review of Systems  Review of Systems  Constitutional: Negative for activity change, chills, fever and unexpected weight change.  HENT: Negative for postnasal drip, rhinorrhea, sinus pressure, sinus pain and sore throat.   Eyes: Negative.   Respiratory: Negative for cough, shortness of breath and wheezing.   Cardiovascular: Negative for chest pain and palpitations.  Gastrointestinal: Negative for constipation, diarrhea, nausea and vomiting.  Endocrine: Negative.   Genitourinary: Negative.   Musculoskeletal: Negative.   Skin: Negative.   Neurological: Negative for dizziness and headaches.  Psychiatric/Behavioral: Negative.  Negative for dysphoric mood. The patient is not nervous/anxious.   All other systems reviewed and are negative.    Physical Exam  BP 124/76 (BP Location: Left Arm, Cuff Size: Normal)   Pulse 86   Temp 98.7 F (37.1 C) (Oral)   Ht 6' (1.829 m)   Wt 251 lb 6.4 oz (114 kg)   SpO2 97%   BMI 34.10 kg/m   Wt Readings from Last 5 Encounters:  10/03/19  251 lb 6.4 oz (114 kg)  10/02/19 251 lb 12.8 oz (114.2 kg)  10/01/18 253 lb 12.8 oz (115.1 kg)  05/24/18 251 lb 9.6 oz (114.1 kg)  03/26/18 252 lb 3.2 oz (114.4 kg)    BMI Readings from Last 5 Encounters:  10/03/19 34.10 kg/m  10/02/19 34.15 kg/m  10/01/18 34.42 kg/m  05/24/18 34.12 kg/m  03/26/18 34.20 kg/m     Physical Exam Vitals and nursing note reviewed.  Constitutional:      General: He is not in acute distress.    Appearance: Normal appearance.  He is obese.  HENT:     Head: Normocephalic and atraumatic.     Right Ear: Hearing and external ear normal.     Left Ear: Hearing and external ear normal.     Nose: Nose normal. No mucosal edema or rhinorrhea.     Right Turbinates: Not enlarged.     Left Turbinates: Not enlarged.     Mouth/Throat:     Mouth: Mucous membranes are dry.     Pharynx: Oropharynx is clear. No oropharyngeal exudate.  Eyes:     Pupils: Pupils are equal, round, and reactive to light.  Cardiovascular:     Rate and Rhythm: Normal rate and regular rhythm.     Pulses: Normal pulses.     Heart sounds: Normal heart sounds. No murmur heard.   Pulmonary:     Effort: Pulmonary effort is normal.     Breath sounds: Normal breath sounds. No decreased breath sounds, wheezing or rales.  Musculoskeletal:     Cervical back: Normal range of motion.     Right lower leg: No edema.     Left lower leg: No edema.  Lymphadenopathy:     Cervical: No cervical adenopathy.  Skin:    General: Skin is warm and dry.     Capillary Refill: Capillary refill takes less than 2 seconds.     Findings: No erythema or rash.  Neurological:     General: No focal deficit present.     Mental Status: He is alert and oriented to person, place, and time.     Motor: No weakness.     Coordination: Coordination normal.     Gait: Gait is intact. Gait normal.  Psychiatric:        Mood and Affect: Mood normal.        Behavior: Behavior normal. Behavior is cooperative.         Thought Content: Thought content normal.        Judgment: Judgment normal.       Assessment & Plan:   Obstructive sleep apnea Plan: Continue CPAP therapy Follow-up in 1 year     Return in about 1 year (around 10/02/2020), or if symptoms worsen or fail to improve, for Follow up with Dr. Annamaria Boots.  Addendum: Patient was able to show his Fitbit data as well as were able to obtain updated data from CPAP compliance report to accurately reflect and match his Fitbit data.   Lauraine Rinne, NP 10/03/2019   This appointment required 26 minutes of patient care (this includes precharting, chart review, review of results, face-to-face care, etc.).

## 2019-10-03 NOTE — Addendum Note (Signed)
Addended by: Satira Sark D on: 10/03/2019 02:22 PM   Modules accepted: Orders

## 2019-10-03 NOTE — Telephone Encounter (Signed)
-----   Message from Elouise Munroe, MD sent at 10/03/2019 10:27 AM EDT ----- Labs normal, no sign of inflammation. Mychart

## 2019-10-03 NOTE — Telephone Encounter (Signed)
CALLED UNABLE TO LEAVE MESSAGE - MAILBOX IS FULL WILL TRY AGAIN    LABS FOR CRP, AND SED RATE

## 2019-10-06 DIAGNOSIS — M25552 Pain in left hip: Secondary | ICD-10-CM | POA: Diagnosis not present

## 2019-10-06 DIAGNOSIS — M50322 Other cervical disc degeneration at C5-C6 level: Secondary | ICD-10-CM | POA: Diagnosis not present

## 2019-10-06 DIAGNOSIS — M9905 Segmental and somatic dysfunction of pelvic region: Secondary | ICD-10-CM | POA: Diagnosis not present

## 2019-10-06 DIAGNOSIS — M9901 Segmental and somatic dysfunction of cervical region: Secondary | ICD-10-CM | POA: Diagnosis not present

## 2019-10-08 ENCOUNTER — Other Ambulatory Visit: Payer: Self-pay

## 2019-10-08 ENCOUNTER — Ambulatory Visit (HOSPITAL_COMMUNITY)
Admission: RE | Admit: 2019-10-08 | Discharge: 2019-10-08 | Disposition: A | Discharge status: Home / Self Care | Payer: BC Managed Care – PPO | Source: Ambulatory Visit | Attending: Family Medicine | Admitting: Family Medicine

## 2019-10-08 DIAGNOSIS — I080 Rheumatic disorders of both mitral and aortic valves: Secondary | ICD-10-CM | POA: Diagnosis not present

## 2019-10-08 DIAGNOSIS — R600 Localized edema: Secondary | ICD-10-CM | POA: Diagnosis not present

## 2019-10-08 DIAGNOSIS — R079 Chest pain, unspecified: Secondary | ICD-10-CM

## 2019-10-08 DIAGNOSIS — I77810 Thoracic aortic ectasia: Secondary | ICD-10-CM | POA: Insufficient documentation

## 2019-10-08 NOTE — Progress Notes (Signed)
  Echocardiogram 2D Echocardiogram has been performed.  James Mack 10/08/2019, 10:42 AM

## 2019-10-09 ENCOUNTER — Encounter (HOSPITAL_COMMUNITY): Payer: BC Managed Care – PPO

## 2019-10-09 ENCOUNTER — Telehealth: Payer: Self-pay | Admitting: *Deleted

## 2019-10-09 DIAGNOSIS — Z01818 Encounter for other preprocedural examination: Secondary | ICD-10-CM

## 2019-10-09 DIAGNOSIS — R079 Chest pain, unspecified: Secondary | ICD-10-CM

## 2019-10-09 NOTE — Telephone Encounter (Signed)
Spoke to patient - he is aware he needs to have BMP  Prior to CCTA.  patient is aware he will needed to have labs before July 16 ,2021

## 2019-10-13 DIAGNOSIS — M25552 Pain in left hip: Secondary | ICD-10-CM | POA: Diagnosis not present

## 2019-10-13 DIAGNOSIS — M9901 Segmental and somatic dysfunction of cervical region: Secondary | ICD-10-CM | POA: Diagnosis not present

## 2019-10-13 DIAGNOSIS — M50322 Other cervical disc degeneration at C5-C6 level: Secondary | ICD-10-CM | POA: Diagnosis not present

## 2019-10-13 DIAGNOSIS — M9905 Segmental and somatic dysfunction of pelvic region: Secondary | ICD-10-CM | POA: Diagnosis not present

## 2019-10-16 DIAGNOSIS — M25552 Pain in left hip: Secondary | ICD-10-CM | POA: Diagnosis not present

## 2019-10-16 DIAGNOSIS — M50322 Other cervical disc degeneration at C5-C6 level: Secondary | ICD-10-CM | POA: Diagnosis not present

## 2019-10-16 DIAGNOSIS — M9901 Segmental and somatic dysfunction of cervical region: Secondary | ICD-10-CM | POA: Diagnosis not present

## 2019-10-16 DIAGNOSIS — M9905 Segmental and somatic dysfunction of pelvic region: Secondary | ICD-10-CM | POA: Diagnosis not present

## 2019-10-20 DIAGNOSIS — M9905 Segmental and somatic dysfunction of pelvic region: Secondary | ICD-10-CM | POA: Diagnosis not present

## 2019-10-20 DIAGNOSIS — M9901 Segmental and somatic dysfunction of cervical region: Secondary | ICD-10-CM | POA: Diagnosis not present

## 2019-10-20 DIAGNOSIS — M50322 Other cervical disc degeneration at C5-C6 level: Secondary | ICD-10-CM | POA: Diagnosis not present

## 2019-10-20 DIAGNOSIS — M25552 Pain in left hip: Secondary | ICD-10-CM | POA: Diagnosis not present

## 2019-10-23 DIAGNOSIS — Z01818 Encounter for other preprocedural examination: Secondary | ICD-10-CM | POA: Diagnosis not present

## 2019-10-23 DIAGNOSIS — R079 Chest pain, unspecified: Secondary | ICD-10-CM | POA: Diagnosis not present

## 2019-10-24 LAB — BASIC METABOLIC PANEL
BUN/Creatinine Ratio: 16 (ref 10–24)
BUN: 18 mg/dL (ref 8–27)
CO2: 27 mmol/L (ref 20–29)
Calcium: 9.3 mg/dL (ref 8.6–10.2)
Chloride: 101 mmol/L (ref 96–106)
Creatinine, Ser: 1.16 mg/dL (ref 0.76–1.27)
GFR calc Af Amer: 77 mL/min/{1.73_m2} (ref >59)
GFR calc non Af Amer: 67 mL/min/{1.73_m2} (ref >59)
Glucose: 87 mg/dL (ref 65–99)
Potassium: 4.5 mmol/L (ref 3.5–5.2)
Sodium: 141 mmol/L (ref 134–144)

## 2019-10-29 DIAGNOSIS — M9905 Segmental and somatic dysfunction of pelvic region: Secondary | ICD-10-CM | POA: Diagnosis not present

## 2019-10-29 DIAGNOSIS — M50322 Other cervical disc degeneration at C5-C6 level: Secondary | ICD-10-CM | POA: Diagnosis not present

## 2019-10-29 DIAGNOSIS — M9901 Segmental and somatic dysfunction of cervical region: Secondary | ICD-10-CM | POA: Diagnosis not present

## 2019-10-29 DIAGNOSIS — M25552 Pain in left hip: Secondary | ICD-10-CM | POA: Diagnosis not present

## 2019-10-30 ENCOUNTER — Telehealth (HOSPITAL_COMMUNITY): Payer: Self-pay | Admitting: *Deleted

## 2019-10-30 NOTE — Telephone Encounter (Signed)
Attempted to call patient regarding upcoming cardiac CT appointment. The voicemail was full and unable to leave a message.  Will attempt to contact patient again.  Burley Saver RN Navigator Cardiac Imaging [facility] [phone] Office [phone] Cell

## 2019-10-30 NOTE — Telephone Encounter (Signed)
Third attempt to call patient regarding upcoming cardiac CT appointment. Unable to leave voicemail  Saunders RN Navigator Cardiac [place] [facility] [phone] Office [phone] Cell

## 2019-10-30 NOTE — Telephone Encounter (Signed)
Second attempt to call patient regarding upcoming cardiac CT appointment. No answer and unable to leave a voicemail.  Burley Saver RN Navigator Cardiac Imaging [facility] [phone] Office [phone] Cell

## 2019-10-31 ENCOUNTER — Other Ambulatory Visit: Payer: Self-pay

## 2019-10-31 ENCOUNTER — Ambulatory Visit (HOSPITAL_COMMUNITY)
Admission: RE | Admit: 2019-10-31 | Discharge: 2019-10-31 | Disposition: A | Discharge status: Home / Self Care | Payer: BC Managed Care – PPO | Source: Ambulatory Visit | Attending: Internal Medicine | Admitting: Internal Medicine

## 2019-10-31 DIAGNOSIS — R079 Chest pain, unspecified: Secondary | ICD-10-CM | POA: Insufficient documentation

## 2019-10-31 MED ORDER — NITROGLYCERIN 0.4 MG SL SUBL: 0.8000 mg | SUBLINGUAL_TABLET | SUBLINGUAL | Status: DC | PRN

## 2019-10-31 MED ORDER — IOHEXOL 350 MG/ML SOLN
80.0000 mL | Freq: Once | INTRAVENOUS | Status: AC | PRN
  Administered 2019-10-31: 80 mL via INTRAVENOUS

## 2019-10-31 MED ORDER — NITROGLYCERIN 0.4 MG SL SUBL
SUBLINGUAL_TABLET | SUBLINGUAL | Status: AC
  Administered 2019-10-31: 0.8 mg
  Filled 2019-10-31: qty 2

## 2019-10-31 MED ORDER — ONDANSETRON HCL 4 MG/2ML IJ SOLN: 4.0000 mg | Freq: Once | INTRAMUSCULAR | Status: DC

## 2019-11-05 DIAGNOSIS — R079 Chest pain, unspecified: Secondary | ICD-10-CM | POA: Diagnosis not present

## 2019-11-07 ENCOUNTER — Ambulatory Visit (HOSPITAL_COMMUNITY)
Admission: RE | Admit: 2019-11-07 | Discharge: 2019-11-07 | Disposition: A | Discharge status: Home / Self Care | Payer: BC Managed Care – PPO | Source: Ambulatory Visit | Attending: Internal Medicine | Admitting: Internal Medicine

## 2019-11-07 DIAGNOSIS — R079 Chest pain, unspecified: Secondary | ICD-10-CM

## 2019-11-09 DIAGNOSIS — R079 Chest pain, unspecified: Secondary | ICD-10-CM | POA: Diagnosis not present

## 2019-11-11 DIAGNOSIS — Z20822 Contact with and (suspected) exposure to covid-19: Secondary | ICD-10-CM | POA: Diagnosis not present

## 2019-11-11 NOTE — Progress Notes (Signed)
Cardiology Office Note:    Date:  11/11/2019   ID:  James Mack, DOB September 11, 1955, MRN [id number]  PCP:  Waldemar Dickens, MD  Cardiologist:  Elouise Munroe, MD  Electrophysiologist:  None   Referring MD: Hulan Fess, MD   Chief Complaint: follow up chest pain  History of Present Illness:    James Mack is a 64 y.o. male with a history of OSA and mild CAD by recent CCTA.  Chest pain has improved, not as bad as June. Working on lifestyle factors.   LDL elevated - with mild CAD needs statin. We discussed this in detail.   We reviewed CCTA and echocardiogram.   Past Medical History:  Diagnosis Date  . Allergic rhinitis   . Arthritis    hips, fingers  . Asthma    childhood  . Cancer ([place])    skin cancer  . GERD (gastroesophageal reflux disease)    occasional  . Headache(784.0)    ocular migraines  . OSA (obstructive sleep apnea)    w/ insomnia NPSG 02/08/10-AHI 6.2 hr  . PONV (postoperative nausea and vomiting)   . Snoring     Past Surgical History:  Procedure Laterality Date  . EYE SURGERY     large "floater" from detachment that did not dissolve was removed  . HERNIA REPAIR Left   . HIP ARTHROSCOPY Right   . RETINAL DETACHMENT SURGERY Right 2007  . right hip replaced  2001  . SKIN CANCER EXCISION    . TONSILLECTOMY    . TOTAL HIP ARTHROPLASTY Left 01/15/2013   Procedure: LEFT TOTAL HIP ARTHROPLASTY ANTERIOR APPROACH;  Surgeon: Gearlean Alf, MD;  Location: [facility];  Service: Orthopedics;  Laterality: Left;    Current Medications: Current Meds  Medication Sig  . allopurinol (ZYLOPRIM) 300 MG tablet Take 300 mg by mouth daily.   Marland Kitchen aspirin EC 81 MG tablet Take 81 mg by mouth daily. Swallow whole.  . cholecalciferol (VITAMIN D) 1000 UNITS tablet Take 1,000 Units by mouth daily.  . furosemide (LASIX) 20 MG tablet Take 20 mg by mouth as needed.   Marland Kitchen ibuprofen (ADVIL,MOTRIN) 600 MG tablet Take 1 tablet (600 mg total) by mouth every 8 (eight) hours as  needed for moderate pain.  . meloxicam (MOBIC) 7.5 MG tablet Take 7.5 mg by mouth daily.  . Multiple Vitamin (MULTIVITAMIN) tablet Take 1 tablet by mouth daily.  Marland Kitchen terbinafine (LAMISIL) 250 MG tablet Take 250 mg by mouth daily.     Allergies:   Patient has no known allergies.   Social History   Socioeconomic History  . Marital status: Divorced    Spouse name: Not on file  . Number of children: Not on file  . Years of education: Not on file  . Highest education level: Not on file  Occupational History  . Not on file  Tobacco Use  . Smoking status: Never Smoker  . Smokeless tobacco: Never Used  Substance and Sexual Activity  . Alcohol use: No  . Drug use: No  . Sexual activity: Never  Other Topics Concern  . Not on file  Social History Narrative  . Not on file   Social Determinants of Health   Financial Resource Strain:   . Difficulty of Paying Living Expenses:   Food Insecurity:   . Worried About Charity fundraiser in the Last Year:   . Arboriculturist in the Last Year:   Transportation Needs:   . Lack  of Transportation (Medical):   Marland Kitchen Lack of Transportation (Non-Medical):   Physical Activity:   . Days of Exercise per Week:   . Minutes of Exercise per Session:   Stress:   . Feeling of Stress :   Social Connections:   . Frequency of Communication with Friends and Family:   . Frequency of Social Gatherings with Friends and Family:   . Attends Religious Services:   . Active Member of Clubs or Organizations:   . Attends Archivist Meetings:   Marland Kitchen Marital Status:      Family History: The patient's family history includes Breast cancer in his mother; Cancer in his sister; Sleep apnea in his sister.  ROS:   Please see the history of present illness.    All other systems reviewed and are negative.  EKGs/Labs/Other Studies Reviewed:    The following studies were reviewed today:  Recent Labs: 10/23/2019: BUN 18; Creatinine, Ser 1.16; Potassium 4.5; Sodium  141  Recent Lipid Panel No results found for: CHOL, TRIG, HDL, CHOLHDL, VLDL, LDLCALC, LDLDIRECT  Physical Exam:    VS:  BP 130/68   Pulse 66   Ht 6' (1.829 m)   Wt 258 lb (117 kg)   SpO2 98%   BMI 34.99 kg/m     Wt Readings from Last 5 Encounters:  11/24/19 258 lb (117 kg)  10/03/19 251 lb 6.4 oz (114 kg)  10/02/19 251 lb 12.8 oz (114.2 kg)  10/01/18 253 lb 12.8 oz (115.1 kg)  05/24/18 251 lb 9.6 oz (114.1 kg)     Constitutional: No acute distress Eyes: sclera non-icteric, normal conjunctiva and lids ENMT: normal dentition, moist mucous membranes Cardiovascular: regular rhythm, normal rate, no murmurs. S1 and S2 normal. Radial pulses normal bilaterally. No jugular venous distention.  Respiratory: clear to auscultation bilaterally GI : normal bowel sounds, soft and nontender. No distention.   MSK: extremities warm, well perfused. No edema.  NEURO: grossly nonfocal exam, moves all extremities. PSYCH: alert and oriented x 3, normal mood and affect.   ASSESSMENT:    1. Chest pain, unspecified type   2. Hyperlipidemia, unspecified hyperlipidemia type   3. Bilateral lower extremity edema   4. Obstructive sleep apnea    PLAN:    Chest pain, unspecified type - Plan: Lipid panel, Comprehensive metabolic panel -improved, continue to monitor. No worrisome findings on Echo or CCTA.   HLD - Will initiate crestor 20 mg daily for aortic atherosclerosis and elevated LDL, as well as mild CAD. Reevaluate with labs in 3 mo.   BL LE edema - recommend compression garments. If swelling is bothersome, recommend daily lasix. Likely from venous stasis moreso than cardiac etiology.  Total time of encounter: 30 minutes total time of encounter, including 25 minutes spent in face-to-face patient care on the date of this encounter. This time includes coordination of care and counseling regarding above mentioned problem list. Remainder of non-face-to-face time involved reviewing chart  documents/testing relevant to the patient encounter and documentation in the medical record. I have independently reviewed documentation from referring provider.   Cherlynn Kaiser, MD [place]  [facility]    Medication Adjustments/Labs and Tests Ordered: Current medicines are reviewed at length with the patient today.  Concerns regarding medicines are outlined above.  Orders Placed This Encounter  Procedures  . Lipid panel  . Comprehensive metabolic panel   Meds ordered this encounter  Medications  . rosuvastatin (CRESTOR) 20 MG tablet    Sig: Take 1 tablet (20  mg total) by mouth daily.    Dispense:  90 tablet    Refill:  3    Patient Instructions  Medication Instructions:  BEGIN TAKING ROSUVASTATIN 20MG  DAILY  TAKE YOUR LASIX DAILY  *If you need a refill on your cardiac medications before your next appointment, please call your pharmacy*   Lab Work: IN 3 MONTHS FASTING LABS: LIPID CMET If you have labs (blood work) drawn today and your tests are completely normal, you will receive your results only by: Marland Kitchen MyChart Message (if you have MyChart) OR . A paper copy in the mail If you have any lab test that is abnormal or we need to change your treatment, we will call you to review the results.   Follow-Up: At [facility], you and your health needs are our priority.  As part of our continuing mission to provide you with exceptional heart care, we have created designated Provider Care Teams.  These Care Teams include your primary Cardiologist (physician) and Advanced Practice Providers (APPs -  Physician Assistants and Nurse Practitioners) who all work together to provide you with the care you need, when you need it.  We recommend signing up for the patient portal called "MyChart".  Sign up information is provided on this After Visit Summary.  MyChart is used to connect with patients for Virtual Visits (Telemedicine).  Patients are able to view lab/test results,  encounter notes, upcoming appointments, etc.  Non-urgent messages can be sent to your provider as well.   To learn more about what you can do with MyChart, go to [email or web address].    Your next appointment:   3 month(s)  The format for your next appointment:   In Person  Provider:   Cherlynn Kaiser, MD   Other Instructions [place]

## 2019-11-24 ENCOUNTER — Other Ambulatory Visit: Payer: Self-pay

## 2019-11-24 ENCOUNTER — Ambulatory Visit: Payer: BC Managed Care – PPO | Admitting: Internal Medicine

## 2019-11-24 ENCOUNTER — Encounter: Payer: Self-pay | Admitting: Internal Medicine

## 2019-11-24 VITALS — BP 130/68 | HR 66 | Ht 72.0 in | Wt 258.0 lb

## 2019-11-24 DIAGNOSIS — E785 Hyperlipidemia, unspecified: Secondary | ICD-10-CM

## 2019-11-24 DIAGNOSIS — R079 Chest pain, unspecified: Secondary | ICD-10-CM

## 2019-11-24 DIAGNOSIS — G4733 Obstructive sleep apnea (adult) (pediatric): Secondary | ICD-10-CM

## 2019-11-24 DIAGNOSIS — R600 Localized edema: Secondary | ICD-10-CM

## 2019-11-24 DIAGNOSIS — Z01818 Encounter for other preprocedural examination: Secondary | ICD-10-CM

## 2019-11-24 MED ORDER — ROSUVASTATIN CALCIUM 20 MG PO TABS
20.0000 mg | ORAL_TABLET | Freq: Every day | ORAL | 3 refills | Status: DC
Start: 2019-11-24 — End: 2020-12-24

## 2019-11-24 NOTE — Patient Instructions (Addendum)
Medication Instructions:  BEGIN TAKING ROSUVASTATIN 20MG  DAILY  TAKE YOUR LASIX DAILY  *If you need a refill on your cardiac medications before your next appointment, please call your pharmacy*   Lab Work: IN 3 MONTHS FASTING LABS: LIPID CMET If you have labs (blood work) drawn today and your tests are completely normal, you will receive your results only by: Marland Kitchen MyChart Message (if you have MyChart) OR . A paper copy in the mail If you have any lab test that is abnormal or we need to change your treatment, we will call you to review the results.   Follow-Up: At [facility], you and your health needs are our priority.  As part of our continuing mission to provide you with exceptional heart care, we have created designated Provider Care Teams.  These Care Teams include your primary Cardiologist (physician) and Advanced Practice Providers (APPs -  Physician Assistants and Nurse Practitioners) who all work together to provide you with the care you need, when you need it.  We recommend signing up for the patient portal called "MyChart".  Sign up information is provided on this After Visit Summary.  MyChart is used to connect with patients for Virtual Visits (Telemedicine).  Patients are able to view lab/test results, encounter notes, upcoming appointments, etc.  Non-urgent messages can be sent to your provider as well.   To learn more about what you can do with MyChart, go to [email or web address].    Your next appointment:   3 month(s)  The format for your next appointment:   In Person  Provider:   Cherlynn Kaiser, MD   Other Instructions [place]

## 2019-12-10 DIAGNOSIS — G4733 Obstructive sleep apnea (adult) (pediatric): Secondary | ICD-10-CM | POA: Diagnosis not present

## 2019-12-22 DIAGNOSIS — Z96642 Presence of left artificial hip joint: Secondary | ICD-10-CM | POA: Diagnosis not present

## 2019-12-22 DIAGNOSIS — M25562 Pain in left knee: Secondary | ICD-10-CM | POA: Diagnosis not present

## 2019-12-22 DIAGNOSIS — Z471 Aftercare following joint replacement surgery: Secondary | ICD-10-CM | POA: Diagnosis not present

## 2019-12-22 DIAGNOSIS — Z96641 Presence of right artificial hip joint: Secondary | ICD-10-CM | POA: Diagnosis not present

## 2019-12-22 DIAGNOSIS — Z96643 Presence of artificial hip joint, bilateral: Secondary | ICD-10-CM | POA: Diagnosis not present

## 2020-01-06 DIAGNOSIS — M25562 Pain in left knee: Secondary | ICD-10-CM | POA: Diagnosis not present

## 2020-01-29 DIAGNOSIS — M25562 Pain in left knee: Secondary | ICD-10-CM | POA: Diagnosis not present

## 2020-02-06 DIAGNOSIS — H353132 Nonexudative age-related macular degeneration, bilateral, intermediate dry stage: Secondary | ICD-10-CM | POA: Diagnosis not present

## 2020-02-06 DIAGNOSIS — H2512 Age-related nuclear cataract, left eye: Secondary | ICD-10-CM | POA: Diagnosis not present

## 2020-02-06 DIAGNOSIS — H524 Presbyopia: Secondary | ICD-10-CM | POA: Diagnosis not present

## 2020-02-06 DIAGNOSIS — H43813 Vitreous degeneration, bilateral: Secondary | ICD-10-CM | POA: Diagnosis not present

## 2020-02-27 ENCOUNTER — Other Ambulatory Visit: Payer: BC Managed Care – PPO

## 2020-02-27 DIAGNOSIS — I2510 Atherosclerotic heart disease of native coronary artery without angina pectoris: Secondary | ICD-10-CM | POA: Diagnosis not present

## 2020-02-27 DIAGNOSIS — G4733 Obstructive sleep apnea (adult) (pediatric): Secondary | ICD-10-CM | POA: Diagnosis not present

## 2020-02-27 DIAGNOSIS — E785 Hyperlipidemia, unspecified: Secondary | ICD-10-CM | POA: Diagnosis not present

## 2020-02-27 DIAGNOSIS — R079 Chest pain, unspecified: Secondary | ICD-10-CM | POA: Diagnosis not present

## 2020-02-27 LAB — COMPREHENSIVE METABOLIC PANEL
ALT: 29 IU/L (ref 0–44)
AST: 31 IU/L (ref 0–40)
Albumin/Globulin Ratio: 2 (ref 1.2–2.2)
Albumin: 4.1 g/dL (ref 3.8–4.8)
Alkaline Phosphatase: 80 IU/L (ref 44–121)
BUN/Creatinine Ratio: 14 (ref 10–24)
BUN: 15 mg/dL (ref 8–27)
Bilirubin Total: 0.8 mg/dL (ref 0.0–1.2)
CO2: 28 mmol/L (ref 20–29)
Calcium: 9.4 mg/dL (ref 8.6–10.2)
Chloride: 102 mmol/L (ref 96–106)
Creatinine, Ser: 1.08 mg/dL (ref 0.76–1.27)
GFR calc Af Amer: 83 mL/min/{1.73_m2} (ref >59)
GFR calc non Af Amer: 72 mL/min/{1.73_m2} (ref >59)
Globulin, Total: 2.1 g/dL (ref 1.5–4.5)
Glucose: 82 mg/dL (ref 65–99)
Potassium: 4.5 mmol/L (ref 3.5–5.2)
Sodium: 141 mmol/L (ref 134–144)
Total Protein: 6.2 g/dL (ref 6.0–8.5)

## 2020-02-27 LAB — LIPID PANEL
Chol/HDL Ratio: 2.8 ratio (ref 0.0–5.0)
Cholesterol, Total: 138 mg/dL (ref 100–199)
HDL: 50 mg/dL (ref >39)
LDL Chol Calc (NIH): 66 mg/dL (ref 0–99)
Triglycerides: 125 mg/dL (ref 0–149)
VLDL Cholesterol Cal: 22 mg/dL (ref 5–40)

## 2020-03-01 ENCOUNTER — Encounter: Payer: Self-pay | Admitting: Internal Medicine

## 2020-03-01 ENCOUNTER — Other Ambulatory Visit: Payer: Self-pay

## 2020-03-01 ENCOUNTER — Ambulatory Visit: Payer: BC Managed Care – PPO | Admitting: Internal Medicine

## 2020-03-01 VITALS — BP 126/86 | HR 56 | Ht 72.0 in | Wt 257.0 lb

## 2020-03-01 DIAGNOSIS — R079 Chest pain, unspecified: Secondary | ICD-10-CM

## 2020-03-01 DIAGNOSIS — E785 Hyperlipidemia, unspecified: Secondary | ICD-10-CM

## 2020-03-01 DIAGNOSIS — I2510 Atherosclerotic heart disease of native coronary artery without angina pectoris: Secondary | ICD-10-CM | POA: Diagnosis not present

## 2020-03-01 DIAGNOSIS — G4733 Obstructive sleep apnea (adult) (pediatric): Secondary | ICD-10-CM | POA: Diagnosis not present

## 2020-03-01 NOTE — Patient Instructions (Signed)

## 2020-03-01 NOTE — Progress Notes (Signed)
Cardiology Office Note:    Date:  03/01/2020   ID:  HODGES TREIBER, DOB 04/26/1955, MRN [id number]  PCP:  Waldemar Dickens, MD  Cardiologist:  Elouise Munroe, MD  Electrophysiologist:  None   Referring MD: Waldemar Dickens, MD   Chief Complaint/Reason for Referral: Follow up chest pain, HLD  History of Present Illness:    James Mack is a 64 y.o. male with a history of OSA, HLD and mild CAD by recent CCTA.   Tolerating crestor 20 mg daily well, lipids are excellent now. LDL 66, HDL 50, Trig 125.  No significant recurrence of chest pain. He denies dyspnea at rest or with exertion, palpitations, PND, orthopnea. Denies cough, fever, chills. Denies nausea, vomiting. Denies syncope or presyncope. Denies dizziness or lightheadedness.  Past Medical History:  Diagnosis Date  . Allergic rhinitis   . Arthritis    hips, fingers  . Asthma    childhood  . Cancer ([place])    skin cancer  . GERD (gastroesophageal reflux disease)    occasional  . Headache(784.0)    ocular migraines  . OSA (obstructive sleep apnea)    w/ insomnia NPSG 02/08/10-AHI 6.2 hr  . PONV (postoperative nausea and vomiting)   . Snoring     Past Surgical History:  Procedure Laterality Date  . EYE SURGERY     large "floater" from detachment that did not dissolve was removed  . HERNIA REPAIR Left   . HIP ARTHROSCOPY Right   . RETINAL DETACHMENT SURGERY Right 2007  . right hip replaced  2001  . SKIN CANCER EXCISION    . TONSILLECTOMY    . TOTAL HIP ARTHROPLASTY Left 01/15/2013   Procedure: LEFT TOTAL HIP ARTHROPLASTY ANTERIOR APPROACH;  Surgeon: Gearlean Alf, MD;  Location: [facility];  Service: Orthopedics;  Laterality: Left;    Current Medications: Current Meds  Medication Sig  . allopurinol (ZYLOPRIM) 300 MG tablet Take 300 mg by mouth daily.   Marland Kitchen aspirin EC 81 MG tablet Take 81 mg by mouth daily. Swallow whole.  . furosemide (LASIX) 20 MG tablet Take 20 mg by mouth as needed.   . meloxicam  (MOBIC) 7.5 MG tablet Take 7.5 mg by mouth daily.  . Multiple Vitamin (MULTIVITAMIN) tablet Take 1 tablet by mouth daily.  Marland Kitchen terbinafine (LAMISIL) 250 MG tablet Take 250 mg by mouth daily.     Allergies:   Patient has no known allergies.   Social History   Tobacco Use  . Smoking status: Never Smoker  . Smokeless tobacco: Never Used  Substance Use Topics  . Alcohol use: No  . Drug use: No     Family History: The patient's family history includes Breast cancer in his mother; Cancer in his sister; Sleep apnea in his sister.  ROS:   Please see the history of present illness.    All other systems reviewed and are negative.  EKGs/Labs/Other Studies Reviewed:    The following studies were reviewed today:  EKG:  Sinus brady rate 56 bpm.  Recent Labs: 02/27/2020: ALT 29; BUN 15; Creatinine, Ser 1.08; Potassium 4.5; Sodium 141  Recent Lipid Panel    Component Value Date/Time   CHOL 138 02/27/2020 0831   TRIG 125 02/27/2020 0831   HDL 50 02/27/2020 0831   CHOLHDL 2.8 02/27/2020 0831   LDLCALC 66 02/27/2020 0831    Physical Exam:    VS:  BP 126/86 (BP Location: Left Arm, Patient Position: Sitting)   Pulse (!) 56  Ht 6' (1.829 m)   Wt 257 lb (116.6 kg)   SpO2 98%   BMI 34.86 kg/m     Wt Readings from Last 5 Encounters:  03/01/20 257 lb (116.6 kg)  11/24/19 258 lb (117 kg)  10/03/19 251 lb 6.4 oz (114 kg)  10/02/19 251 lb 12.8 oz (114.2 kg)  10/01/18 253 lb 12.8 oz (115.1 kg)    Constitutional: No acute distress Eyes: sclera non-icteric, normal conjunctiva and lids ENMT: normal dentition, moist mucous membranes Cardiovascular: regular rhythm, normal rate, no murmurs. S1 and S2 normal. Radial pulses normal bilaterally. No jugular venous distention.  Respiratory: clear to auscultation bilaterally GI : normal bowel sounds, soft and nontender. No distention.   MSK: extremities warm, well perfused. No edema.  NEURO: grossly nonfocal exam, moves all extremities. PSYCH:  alert and oriented x 3, normal mood and affect.   ASSESSMENT:    1. Hyperlipidemia, unspecified hyperlipidemia type   2. Obstructive sleep apnea   3. Coronary artery disease involving native coronary artery of native heart without angina pectoris    PLAN:    Hyperlipidemia, unspecified hyperlipidemia type - Plan: EKG 12-Lead - continue crestor 20 mg daily, lipids at goal, normal LFTs  Obstructive sleep apnea - continue CPAP  Coronary artery disease involving native coronary artery of native heart without angina pectoris Chest pain, unspecified type - mild CAD on CCTA, no recurrence of chest pain. Continue ASA 81 mg daily and crestor 20 mg daily.  Total time of encounter: 20 minutes total time of encounter, including 15 minutes spent in face-to-face patient care on the date of this encounter. This time includes coordination of care and counseling regarding above mentioned problem list. Remainder of non-face-to-face time involved reviewing chart documents/testing relevant to the patient encounter and documentation in the medical record. I have independently reviewed documentation from referring provider.   Cherlynn Kaiser, MD [place]  [facility]    Medication Adjustments/Labs and Tests Ordered: Current medicines are reviewed at length with the patient today.  Concerns regarding medicines are outlined above.   Orders Placed This Encounter  Procedures  . EKG 12-Lead     No orders of the defined types were placed in this encounter.   Patient Instructions  Medication Instructions:  No Changes In Medications at this time.  *If you need a refill on your cardiac medications before your next appointment, please call your pharmacy*  Follow-Up: At [facility], you and your health needs are our priority.  As part of our continuing mission to provide you with exceptional heart care, we have created designated Provider Care Teams.  These Care Teams include your primary  Cardiologist (physician) and Advanced Practice Providers (APPs -  Physician Assistants and Nurse Practitioners) who all work together to provide you with the care you need, when you need it.   Your next appointment:   1 year(s)  The format for your next appointment:   In Person  Provider:   Cherlynn Kaiser, MD

## 2020-03-11 DIAGNOSIS — Z1159 Encounter for screening for other viral diseases: Secondary | ICD-10-CM | POA: Diagnosis not present

## 2020-03-16 DIAGNOSIS — K640 First degree hemorrhoids: Secondary | ICD-10-CM | POA: Diagnosis not present

## 2020-03-16 DIAGNOSIS — K5730 Diverticulosis of large intestine without perforation or abscess without bleeding: Secondary | ICD-10-CM | POA: Diagnosis not present

## 2020-03-16 DIAGNOSIS — Z1211 Encounter for screening for malignant neoplasm of colon: Secondary | ICD-10-CM | POA: Diagnosis not present

## 2020-03-16 DIAGNOSIS — D122 Benign neoplasm of ascending colon: Secondary | ICD-10-CM | POA: Diagnosis not present

## 2020-10-31 ENCOUNTER — Other Ambulatory Visit: Payer: Self-pay | Admitting: Internal Medicine

## 2020-12-24 ENCOUNTER — Telehealth: Payer: Self-pay | Admitting: Internal Medicine

## 2020-12-24 ENCOUNTER — Other Ambulatory Visit: Payer: Self-pay

## 2020-12-24 MED ORDER — ROSUVASTATIN CALCIUM 20 MG PO TABS: 20.0000 mg | ORAL_TABLET | Freq: Every day | ORAL | 0 refills | Status: DC

## 2020-12-24 NOTE — Telephone Encounter (Signed)
*  STAT* If patient is at the pharmacy, call can be transferred to refill team.   1. Which medications need to be refilled? (please list name of each medication and dose if known) Rosuvastatin  2. Which pharmacy/location (including street and city if local pharmacy) is medication to be sent to? [facility] [place]  3. Do they need a 30 day or 90 day supply? 90 days and refills

## 2020-12-27 ENCOUNTER — Telehealth: Payer: Self-pay | Admitting: Internal Medicine

## 2020-12-27 MED ORDER — ROSUVASTATIN CALCIUM 20 MG PO TABS: 20.0000 mg | ORAL_TABLET | Freq: Every day | ORAL | 0 refills | Status: DC

## 2020-12-27 NOTE — Telephone Encounter (Signed)
*  STAT* If patient is at the pharmacy, call can be transferred to refill team.   1. Which medications need to be refilled? (please list name of each medication and dose if known) rosuvastatin (CRESTOR) 20 MG tablet  2. Which pharmacy/location (including street and city if local pharmacy) is medication to be sent to? [id number] [place], [place] - [address]  3. Do they need a 30 day or 90 day supply? 90  

## 2021-02-19 ENCOUNTER — Other Ambulatory Visit: Payer: Self-pay | Admitting: Internal Medicine

## 2021-03-01 ENCOUNTER — Telehealth (INDEPENDENT_AMBULATORY_CARE_PROVIDER_SITE_OTHER): Payer: Self-pay | Admitting: Internal Medicine

## 2021-03-01 ENCOUNTER — Encounter: Payer: Self-pay | Admitting: Internal Medicine

## 2021-03-01 VITALS — HR 80 | Ht 72.0 in | Wt 266.0 lb

## 2021-03-01 DIAGNOSIS — I2510 Atherosclerotic heart disease of native coronary artery without angina pectoris: Secondary | ICD-10-CM

## 2021-03-01 NOTE — Progress Notes (Signed)
Patient unable to connect to virtual visit with audio and video. Rescheduled for in office next week.  Cherlynn Kaiser, MD, [place]

## 2021-03-08 ENCOUNTER — Other Ambulatory Visit: Payer: Self-pay

## 2021-03-08 ENCOUNTER — Encounter: Payer: Self-pay | Admitting: Internal Medicine

## 2021-03-08 ENCOUNTER — Ambulatory Visit: Payer: 59 | Admitting: Internal Medicine

## 2021-03-08 VITALS — BP 138/80 | HR 55 | Ht 72.0 in | Wt 276.6 lb

## 2021-03-08 DIAGNOSIS — E785 Hyperlipidemia, unspecified: Secondary | ICD-10-CM | POA: Diagnosis not present

## 2021-03-08 DIAGNOSIS — I2510 Atherosclerotic heart disease of native coronary artery without angina pectoris: Secondary | ICD-10-CM

## 2021-03-08 DIAGNOSIS — G4733 Obstructive sleep apnea (adult) (pediatric): Secondary | ICD-10-CM | POA: Diagnosis not present

## 2021-03-08 DIAGNOSIS — Z79899 Other long term (current) drug therapy: Secondary | ICD-10-CM | POA: Diagnosis not present

## 2021-03-08 MED ORDER — ROSUVASTATIN CALCIUM 20 MG PO TABS: 20.0000 mg | ORAL_TABLET | Freq: Every day | ORAL | 3 refills | Status: DC

## 2021-03-08 NOTE — Progress Notes (Signed)
Cardiology Office Note:    Date:  03/08/2021   ID:  James Mack, DOB 1955-08-11, MRN [id number]  PCP:  Waldemar Dickens, MD  Cardiologist:  Elouise Munroe, MD  Electrophysiologist:  None   Referring MD: Waldemar Dickens, MD   Chief Complaint/Reason for Referral: Follow up chest pain, HLD  History of Present Illness:    James Mack is a 65 y.o. male with a history of OSA, HLD and mild CAD by recent CCTA.   Tolerating crestor 20 mg daily well, lipids are optimized now.   The patient denies chest pain, chest pressure, dyspnea at rest or with exertion, palpitations, PND, orthopnea, or leg swelling. Denies cough, fever, chills. Denies nausea, vomiting. Denies syncope or presyncope. Denies dizziness or lightheadedness. Denies snoring.   Past Medical History:  Diagnosis Date   Allergic rhinitis    Arthritis    hips, fingers   Asthma    childhood   Cancer ([place])    skin cancer   GERD (gastroesophageal reflux disease)    occasional   Headache(784.0)    ocular migraines   OSA (obstructive sleep apnea)    w/ insomnia NPSG 02/08/10-AHI 6.2 hr   PONV (postoperative nausea and vomiting)    Snoring     Past Surgical History:  Procedure Laterality Date   EYE SURGERY     large "floater" from detachment that did not dissolve was removed   HERNIA REPAIR Left    HIP ARTHROSCOPY Right    RETINAL DETACHMENT SURGERY Right 2007   right hip replaced  2001   SKIN CANCER EXCISION     TONSILLECTOMY     TOTAL HIP ARTHROPLASTY Left 01/15/2013   Procedure: LEFT TOTAL HIP ARTHROPLASTY ANTERIOR APPROACH;  Surgeon: Gearlean Alf, MD;  Location: [facility];  Service: Orthopedics;  Laterality: Left;    Current Medications: Current Meds  Medication Sig   allopurinol (ZYLOPRIM) 300 MG tablet Take 300 mg by mouth daily.    aspirin EC 81 MG tablet Take 81 mg by mouth daily. Swallow whole.   furosemide (LASIX) 20 MG tablet Take 20 mg by mouth as needed.    itraconazole (SPORANOX) 100 MG  capsule Take by mouth.   meloxicam (MOBIC) 7.5 MG tablet Take 7.5 mg by mouth daily.   Multiple Vitamin (MULTIVITAMIN) tablet Take 1 tablet by mouth daily.   [DISCONTINUED] rosuvastatin (CRESTOR) 20 MG tablet Take 1 tablet (20 mg total) by mouth daily. Take 1 tablet (20 mg total) by mouth daily.Schedule office visit for future refills     Allergies:   Patient has no known allergies.   Social History   Tobacco Use   Smoking status: Never   Smokeless tobacco: Never  Substance Use Topics   Alcohol use: No   Drug use: No     Family History: The patient's family history includes Breast cancer in his mother; Cancer in his sister; Sleep apnea in his sister.  ROS:   Please see the history of present illness.    All other systems reviewed and are negative.  EKGs/Labs/Other Studies Reviewed:    The following studies were reviewed today:  EKG:  Sinus brady rate 55 bpm.  Recent Labs: No results found for requested labs within last 8760 hours.  Recent Lipid Panel    Component Value Date/Time   CHOL 138 02/27/2020 0831   TRIG 125 02/27/2020 0831   HDL 50 02/27/2020 0831   CHOLHDL 2.8 02/27/2020 0831   LDLCALC 66 02/27/2020 0831  Physical Exam:    VS:  BP 138/80   Pulse (!) 55   Ht 6' (1.829 m)   Wt 276 lb 9.6 oz (125.5 kg)   SpO2 98%   BMI 37.51 kg/m     Wt Readings from Last 5 Encounters:  03/08/21 276 lb 9.6 oz (125.5 kg)  03/01/21 266 lb (120.7 kg)  03/01/20 257 lb (116.6 kg)  11/24/19 258 lb (117 kg)  10/03/19 251 lb 6.4 oz (114 kg)    Constitutional: No acute distress Eyes: sclera non-icteric, normal conjunctiva and lids ENMT: normal dentition, moist mucous membranes Cardiovascular: regular rhythm, normal rate, no murmurs. S1 and S2 normal. Radial pulses normal bilaterally. No jugular venous distention.  Respiratory: clear to auscultation bilaterally GI : normal bowel sounds, soft and nontender. No distention.   MSK: extremities warm, well perfused. No  edema.  NEURO: grossly nonfocal exam, moves all extremities. PSYCH: alert and oriented x 3, normal mood and affect.   ASSESSMENT:    1. Coronary artery disease involving native coronary artery of native heart without angina pectoris   2. Hyperlipidemia, unspecified hyperlipidemia type   3. Medication management   4. Obstructive sleep apnea    PLAN:    Hyperlipidemia, unspecified hyperlipidemia type - Plan: EKG 12-Lead - continue crestor 20 mg daily, lipids at goal, he will update [place] if lipids have changed from annual visit in July.  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 Continue ASA 81 mg daily and crestor 20 mg daily.  Total time of encounter: 20 minutes total time of encounter, including 18 minutes spent in face-to-face patient care on the date of this encounter. This time includes coordination of care and counseling regarding above mentioned problem list. Remainder of non-face-to-face time involved reviewing chart documents/testing relevant to the patient encounter and documentation in the medical record. I have independently reviewed documentation from referring provider.   Cherlynn Kaiser, MD, [facility]     Medication Adjustments/Labs and Tests Ordered: Current medicines are reviewed at length with the patient today.  Concerns regarding medicines are outlined above.   Orders Placed This Encounter  Procedures   EKG 12-Lead     Meds ordered this encounter  Medications   rosuvastatin (CRESTOR) 20 MG tablet    Sig: Take 1 tablet (20 mg total) by mouth daily. Take 1 tablet (20 mg total) by mouth daily.Schedule office visit for future refills    Dispense:  90 tablet    Refill:  3    Patient Instructions  Medication Instructions:  ROSUVASTATIN REFILLED  *If you need a  refill on your cardiac medications before your next appointment, please call your pharmacy*  Follow-Up: At [facility], you and your health needs are our priority.  As part of our continuing mission to provide you with exceptional heart care, we have created designated Provider Care Teams.  These Care Teams include your primary Cardiologist (physician) and Advanced Practice Providers (APPs -  Physician Assistants and Nurse Practitioners) who all work together to provide you with the care you need, when you need it.   Your next appointment:   1 year(s)  The format for your next appointment:   In Person  Provider:   Elouise Munroe, MD

## 2021-03-08 NOTE — Patient Instructions (Signed)
Medication Instructions:  ROSUVASTATIN REFILLED  *If you need a refill on your cardiac medications before your next appointment, please call your pharmacy*  Follow-Up: At [facility], you and your health needs are our priority.  As part of our continuing mission to provide you with exceptional heart care, we have created designated Provider Care Teams.  These Care Teams include your primary Cardiologist (physician) and Advanced Practice Providers (APPs -  Physician Assistants and Nurse Practitioners) who all work together to provide you with the care you need, when you need it.   Your next appointment:   1 year(s)  The format for your next appointment:   In Person  Provider:   Elouise Munroe, MD

## 2021-05-17 ENCOUNTER — Emergency Department (HOSPITAL_COMMUNITY): Payer: 59

## 2021-05-17 ENCOUNTER — Encounter (HOSPITAL_COMMUNITY): Payer: Self-pay | Admitting: Emergency Medicine

## 2021-05-17 ENCOUNTER — Emergency Department (HOSPITAL_COMMUNITY)
Admission: EM | Admit: 2021-05-17 | Discharge: 2021-05-18 | Disposition: A | Discharge status: Home / Self Care | Payer: 59 | Attending: Emergency Medicine | Admitting: Emergency Medicine

## 2021-05-17 DIAGNOSIS — K429 Umbilical hernia without obstruction or gangrene: Secondary | ICD-10-CM | POA: Insufficient documentation

## 2021-05-17 DIAGNOSIS — R109 Unspecified abdominal pain: Secondary | ICD-10-CM | POA: Diagnosis present

## 2021-05-17 DIAGNOSIS — Z5321 Procedure and treatment not carried out due to patient leaving prior to being seen by health care provider: Secondary | ICD-10-CM | POA: Diagnosis not present

## 2021-05-17 LAB — URINALYSIS, ROUTINE W REFLEX MICROSCOPIC
Bilirubin Urine: NEGATIVE
Glucose, UA: NEGATIVE mg/dL
Hgb urine dipstick: NEGATIVE
Ketones, ur: NEGATIVE mg/dL
Leukocytes,Ua: NEGATIVE
Nitrite: NEGATIVE
Protein, ur: NEGATIVE mg/dL
Specific Gravity, Urine: 1.025 (ref 1.005–1.030)
pH: 5 (ref 5.0–8.0)

## 2021-05-17 LAB — COMPREHENSIVE METABOLIC PANEL
ALT: 33 U/L (ref 0–44)
AST: 34 U/L (ref 15–41)
Albumin: 3.9 g/dL (ref 3.5–5.0)
Alkaline Phosphatase: 66 U/L (ref 38–126)
Anion gap: 9 (ref 5–15)
BUN: 15 mg/dL (ref 8–23)
CO2: 28 mmol/L (ref 22–32)
Calcium: 9.1 mg/dL (ref 8.9–10.3)
Chloride: 102 mmol/L (ref 98–111)
Creatinine, Ser: 1.11 mg/dL (ref 0.61–1.24)
GFR, Estimated: >60 mL/min (ref >60)
Glucose, Bld: 90 mg/dL (ref 70–99)
Potassium: 3.7 mmol/L (ref 3.5–5.1)
Sodium: 139 mmol/L (ref 135–145)
Total Bilirubin: 1.1 mg/dL (ref 0.3–1.2)
Total Protein: 6.5 g/dL (ref 6.5–8.1)

## 2021-05-17 LAB — CBC
HCT: 43.5 % (ref 39.0–52.0)
Hemoglobin: 15.3 g/dL (ref 13.0–17.0)
MCH: 31.4 pg (ref 26.0–34.0)
MCHC: 35.2 g/dL (ref 30.0–36.0)
MCV: 89.3 fL (ref 80.0–100.0)
Platelets: 194 10*3/uL (ref 150–400)
RBC: 4.87 MIL/uL (ref 4.22–5.81)
RDW: 12.4 % (ref 11.5–15.5)
WBC: 6.1 10*3/uL (ref 4.0–10.5)
nRBC: 0 % (ref 0.0–0.2)

## 2021-05-17 LAB — LIPASE, BLOOD: Lipase: 42 U/L (ref 11–51)

## 2021-05-17 MED ORDER — IOHEXOL 350 MG/ML SOLN
100.0000 mL | Freq: Once | INTRAVENOUS | Status: AC | PRN
  Administered 2021-05-17: 100 mL via INTRAVENOUS

## 2021-05-17 NOTE — ED Triage Notes (Signed)
Patient with known umbilical hernia that has caused mild intermittent pain here with complaint of episode of more intense pain earlier today. Patient also reports that when he had a bowel movement during this episode the pain increased. Pain has resolved at the time and has no complaints at this time.

## 2021-05-17 NOTE — ED Provider Triage Note (Signed)
Emergency Medicine Provider Triage Evaluation Note  James Mack , a 66 y.o. male  was evaluated in triage.  Pt complains of abdominal pain associated with his umbilical hernia.  Reports that pain has been intermittent for "a while."  Today patient had 2 episodes of increased pain.  First came after lifting things at work and the second came after attempting to have a bowel movement.  Patient reports that pain has improved since then.  States that hernia is protruding more than usual.  Review of Systems  Positive: Abdominal pain Negative: Nausea, vomiting, constipation, diarrhea  Physical Exam  BP (!) 160/107 (BP Location: Right Arm)    Pulse 82    Temp 99.3 F (37.4 C) (Oral)    Resp 16    SpO2 98%  Gen:   Awake, no distress   Resp:  Normal effort  MSK:   Moves extremities without difficulty  Other:  Abdomen protuberant, soft.  Umbilical hernia noted.  Hernia mildly tender to palpation.  Unable to reduce hernia in exam chair.  No guarding or rebound tenderness.  Medical Decision Making  Medically screening exam initiated at 12:04 PM.  Appropriate orders placed.  Doy Hutching was informed that the remainder of the evaluation will be completed by another provider, this initial triage assessment does not replace that evaluation, and the importance of remaining in the ED until their evaluation is complete.     Loni Beckwith, [place] 05/17/21 1206

## 2021-05-18 NOTE — ED Notes (Signed)
Patient states he is leaving d/t wait time. IV removed by this Probation officer

## 2021-05-23 ENCOUNTER — Telehealth: Payer: Self-pay | Admitting: Internal Medicine

## 2021-05-23 DIAGNOSIS — G4733 Obstructive sleep apnea (adult) (pediatric): Secondary | ICD-10-CM

## 2021-05-24 NOTE — Telephone Encounter (Signed)
I called the patient and he voices understanding. He did not have any questions. He reports he will keep the OV. Nothing further needed.

## 2021-05-24 NOTE — Telephone Encounter (Signed)
Please advise if ok to send in CPAP supplies. This patient has an OV next week.

## 2021-05-24 NOTE — Telephone Encounter (Signed)
Ok to refill mask of choice, supplies

## 2021-05-30 ENCOUNTER — Ambulatory Visit: Payer: 59 | Admitting: Primary Care

## 2021-05-30 ENCOUNTER — Encounter: Payer: Self-pay | Admitting: Primary Care

## 2021-05-30 ENCOUNTER — Other Ambulatory Visit: Payer: Self-pay

## 2021-05-30 DIAGNOSIS — G4733 Obstructive sleep apnea (adult) (pediatric): Secondary | ICD-10-CM | POA: Diagnosis not present

## 2021-05-30 NOTE — Patient Instructions (Addendum)
Recommendations: Continue to wear CPAP every night for 4-6 hours or more Do not drive if experiencing excessive daytime sleepiness Continue to work on weight loss efforts  If unable to wear CPAP when traveling, focus on side sleeping position  Look into [facility] CPAP supplies and let us know if you need an order   Follow-up: 1 year with Beth or APP for OSA   Living With Sleep Apnea Sleep apnea is a condition in which breathing pauses or becomes shallow during sleep. Sleep apnea is most commonly caused by a collapsed or blocked airway. People with sleep apnea usually snore loudly. They may have times when they gasp and stop breathing for 10 seconds or more during sleep. This may happen many times during the night. The breaks in breathing also interrupt the deep sleep that you need to feel rested. Even if you do not completely wake up from the gaps in breathing, your sleep may not be restful and you feel tired during the day. You may also have a headache in the morning and low energy during the day, and you may feel anxious or depressed. How can sleep apnea affect me? Sleep apnea increases your chances of extreme tiredness during the day (daytime fatigue). It can also increase your risk for health conditions, such as: Heart attack. Stroke. Obesity. Type 2 diabetes. Heart failure. Irregular heartbeat. High blood pressure. If you have daytime fatigue as a result of sleep apnea, you may be more likely to: Perform poorly at school or work. Fall asleep while driving. Have difficulty with attention. Develop depression or anxiety. Have sexual dysfunction. What actions can I take to manage sleep apnea? Sleep apnea treatment  If you were given a device to open your airway while you sleep, use it only as told by your health care provider. You may be given: An oral appliance. This is a custom-made mouthpiece that shifts your lower jaw forward. A continuous positive airway pressure (CPAP) device. This  device blows air through a mask when you breathe out (exhale). A nasal expiratory positive airway pressure (EPAP) device. This device has valves that you put into each nostril. A bi-level positive airway pressure (BIPAP) device. This device blows air through a mask when you breathe in (inhale) and breathe out (exhale). You may need surgery if other treatments do not work for you. Sleep habits Go to sleep and wake up at the same time every day. This helps set your internal clock (circadian rhythm) for sleeping. If you stay up later than usual, such as on weekends, try to get up in the morning within 2 hours of your normal wake time. Try to get at least 7-9 hours of sleep each night. Stop using a computer, tablet, and mobile phone a few hours before bedtime. Do not take long naps during the day. If you nap, limit it to 30 minutes. Have a relaxing bedtime routine. Reading or listening to music may relax you and help you sleep. Use your bedroom only for sleep. Keep your television and computer out of your bedroom. Keep your bedroom cool, dark, and quiet. Use a supportive mattress and pillows. Follow your health care provider's instructions for other changes to sleep habits. Nutrition Do not eat heavy meals in the evening. Do not have caffeine in the later part of the day. The effects of caffeine can last for more than 5 hours. Follow your health care provider's or dietitian's instructions for any diet changes. Lifestyle   Do not drink alcohol before  bedtime. Alcohol can cause you to fall asleep at first, but then it can cause you to wake up in the middle of the night and have trouble getting back to sleep. Do not use any products that contain nicotine or tobacco. These products include cigarettes, chewing tobacco, and vaping devices, such as e-cigarettes. If you need help quitting, ask your health care provider. Medicines Take over-the-counter and prescription medicines only as told by your  health care provider. Do not use over-the-counter sleep medicine. You can become dependent on this medicine, and it can make sleep apnea worse. Do not use medicines, such as sedatives and narcotics, unless told by your health care provider. Activity Exercise on most days, but avoid exercising in the evening. Exercising near bedtime can interfere with sleeping. If possible, spend time outside every day. Natural light helps regulate your circadian rhythm. General information Lose weight if you need to, and maintain a healthy weight. Keep all follow-up visits. This is important. If you are having surgery, make sure to tell your health care provider that you have sleep apnea. You may need to bring your device with you. Where to find more information Learn more about sleep apnea and daytime fatigue from: American Sleep Association: sleepassociation.[place]: sleepfoundation.org National Heart, Lung, and Blood Institute: https://www.[place]man-[place].biz/ Summary Sleep apnea is a condition in which breathing pauses or becomes shallow during sleep. Sleep apnea can cause daytime fatigue and other serious health conditions. You may need to wear a device while sleeping to help keep your airway open. If you are having surgery, make sure to tell your health care provider that you have sleep apnea. You may need to bring your device with you. Making changes to sleep habits, diet, lifestyle, and activity can help you manage sleep apnea. This information is not intended to replace advice given to you by your health care provider. Make sure you discuss any questions you have with your health care provider. Document Revised: 11/03/2020 Document Reviewed: 03/05/2020 Elsevier Patient Education  2022 [place].  CPAP and BIPAP Information CPAP and BIPAP are methods that use air pressure to keep your airways open and to help you breathe well. CPAP and BIPAP use different amounts of pressure. Your health care  provider will tell you whether CPAP or BIPAP would be more helpful for you. CPAP stands for "continuous positive airway pressure." With CPAP, the amount of pressure stays the same while you breathe in (inhale) and out (exhale). BIPAP stands for "bi-level positive airway pressure." With BIPAP, the amount of pressure will be higher when you inhale and lower when you exhale. This allows you to take larger breaths. CPAP or BIPAP may be used in the hospital, or your health care provider may want you to use it at home. You may need to have a sleep study before your health care provider can order a machine for you to use at home. What are the advantages? CPAP or BIPAP can be helpful if you have: Sleep apnea. Chronic obstructive pulmonary disease (COPD). Heart failure. Medical conditions that cause muscle weakness, including muscular dystrophy or amyotrophic lateral sclerosis (ALS). Other problems that cause breathing to be shallow, weak, abnormal, or difficult. CPAP and BIPAP are most commonly used for obstructive sleep apnea (OSA) to keep the airways from collapsing when the muscles relax during sleep. What are the risks? Generally, this is a safe treatment. However, problems may occur, including: Irritated skin or skin sores if the mask does not fit properly. Dry or  stuffy nose or nosebleeds. Dry mouth. Feeling gassy or bloated. Sinus or lung infection if the equipment is not cleaned properly. When should CPAP or BIPAP be used? In most cases, the mask only needs to be worn during sleep. Generally, the mask needs to be worn throughout the night and during any daytime naps. People with certain medical conditions may also need to wear the mask at other times, such as when they are awake. Follow instructions from your health care provider about when to use the machine. What happens during CPAP or BIPAP? Both CPAP and BIPAP are provided by a small machine with a flexible plastic tube that attaches to a  plastic mask that you wear. Air is blown through the mask into your nose or mouth. The amount of pressure that is used to blow the air can be adjusted on the machine. Your health care provider will set the pressure setting and help you find the best mask for you. Tips for using the mask Because the mask needs to be snug, some people feel trapped or closed-in (claustrophobic) when first using the mask. If you feel this way, you may need to get used to the mask. One way to do this is to hold the mask loosely over your nose or mouth and then gradually apply the mask more snugly. You can also gradually increase the amount of time that you use the mask. Masks are available in various types and sizes. If your mask does not fit well, talk with your health care provider about getting a different one. Some common types of masks include: Full face masks, which fit over the mouth and nose. Nasal masks, which fit over the nose. Nasal pillow or prong masks, which fit into the nostrils. If you are using a mask that fits over your nose and you tend to breathe through your mouth, a chin strap may be applied to help keep your mouth closed. Use a skin barrier to protect your skin as told by your health care provider. Some CPAP and BIPAP machines have alarms that may sound if the mask comes off or develops a leak. If you have trouble with the mask, it is very important that you talk with your health care provider about finding a way to make the mask easier to tolerate. Do not stop using the mask. There could be a negative impact on your health if you stop using the mask. Tips for using the machine Place your CPAP or BIPAP machine on a secure table or stand near an electrical outlet. Know where the on/off switch is on the machine. Follow instructions from your health care provider about how to set the pressure on your machine and when you should use it. Do not eat or drink while the CPAP or BIPAP machine is on. Food or  fluids could get pushed into your lungs by the pressure of the CPAP or BIPAP. For home use, CPAP and BIPAP machines can be rented or purchased through home health care companies. Many different brands of machines are available. Renting a machine before purchasing may help you find out which particular machine works well for you. Your health insurance company may also decide which machine you may get. Keep the CPAP or BIPAP machine and attachments clean. Ask your health care provider for specific instructions. Check the humidifier if you have a dry stuffy nose or nosebleeds. Make sure it is working correctly. Follow these instructions at home: Take over-the-counter and prescription medicines only as  told by your health care provider. Ask if you can take sinus medicine if your sinuses are blocked. Do not use any products that contain nicotine or tobacco. These products include cigarettes, chewing tobacco, and vaping devices, such as e-cigarettes. If you need help quitting, ask your health care provider. Keep all follow-up visits. This is important. Contact a health care provider if: You have redness or pressure sores on your head, face, mouth, or nose from the mask or head gear. You have trouble using the CPAP or BIPAP machine. You cannot tolerate wearing the CPAP or BIPAP mask. Someone tells you that you snore even when wearing your CPAP or BIPAP. Get help right away if: You have trouble breathing. You feel confused. Summary CPAP and BIPAP are methods that use air pressure to keep your airways open and to help you breathe well. If you have trouble with the mask, it is very important that you talk with your health care provider about finding a way to make the mask easier to tolerate. Do not stop using the mask. There could be a negative impact to your health if you stop using the mask. Follow instructions from your health care provider about when to use the machine. This information is not intended  to replace advice given to you by your health care provider. Make sure you discuss any questions you have with your health care provider. Document Revised: 11/03/2020 Document Reviewed: 03/05/2020 Elsevier Patient Education  2022 [employer].

## 2021-05-30 NOTE — Assessment & Plan Note (Signed)
-   Stable; Patient is 90% compliant with CPAP > 4 hours. Pressure 9cm h20, residual AHI 0.6/hr. No changes today. Encourage continued compliance wit CPAP 4-6 hours every night and weight loss efforts. Advised against driving if experience daytime sleepiness. DME company is choice medication, he does not need supplies renewed at this time. Follow-up in 1 year or sooner if needed.

## 2021-05-30 NOTE — Progress Notes (Signed)
@Patient  ID: James Mack, male    DOB: [birth year], 66 y.o.   MRN: [id number]  Chief Complaint  Patient presents with   Follow-up    Cpap compliance    Referring provider: Waldemar Dickens, MD  HPI: 66 year old male, never smoked. PMH significant for OSA, asthma, season allergies. Patient of Dr. Annamaria Boots, last seen 10/03/19 by pulmonary NP. NPSG 12/22/09, mild OSA/ AHI 6.2/hr.   05/30/2021 Patient presents today for annual follow-up OSA. He is doing well. No acute complaints. He is compliant with CPAP use. Sleeping well at night. He has noticed improvement in daytime sleepiness since starting CPAP. He is unable to bring CPAP machine when occasionally traveling for work. DME company choice medication. [facility] 5/24.   Airview download 06/27/20-07/26/20 Usage 30/30 days (100%), 27 days (90%) Average usage days used 5 hours 2 mins Pressure 9cm h20 Airleaks 10.2/min AHI 0.6/hr   Allergies  Allergen Reactions   Shellfish Allergy     Immunization History  Administered Date(s) Administered   Influenza Split 12/10/2011, 01/08/2013   Influenza,inj,quad, With Preservative 12/09/2016   Influenza-Unspecified 12/09/2017, 12/31/2018   PFIZER(Purple Top)SARS-COV-2 Vaccination 06/21/2019, 07/12/2019   Tdap 11/11/2007    Past Medical History:  Diagnosis Date   Allergic rhinitis    Arthritis    hips, fingers   Asthma    childhood   Cancer ([place])    skin cancer   GERD (gastroesophageal reflux disease)    occasional   Headache(784.0)    ocular migraines   OSA (obstructive sleep apnea)    w/ insomnia NPSG 02/08/10-AHI 6.2 hr   PONV (postoperative nausea and vomiting)    Snoring     Tobacco History: Social History   Tobacco Use  Smoking Status Never  Smokeless Tobacco Never   Counseling given: Not Answered   Outpatient Medications Prior to Visit  Medication Sig Dispense Refill   allopurinol (ZYLOPRIM) 300 MG tablet Take 300 mg by mouth daily.      aspirin EC 81 MG tablet  Take 81 mg by mouth daily. Swallow whole.     furosemide (LASIX) 20 MG tablet Take 20 mg by mouth as needed.      itraconazole (SPORANOX) 100 MG capsule Take by mouth.     meloxicam (MOBIC) 7.5 MG tablet Take 7.5 mg by mouth daily.  3   Multiple Vitamin (MULTIVITAMIN) tablet Take 1 tablet by mouth daily.     rosuvastatin (CRESTOR) 20 MG tablet Take 1 tablet (20 mg total) by mouth daily. Take 1 tablet (20 mg total) by mouth daily.Schedule office visit for future refills 90 tablet 3   terbinafine (LAMISIL) 250 MG tablet Take 250 mg by mouth daily. (Patient not taking: Reported on 03/08/2021)     No facility-administered medications prior to visit.    Review of Systems  Review of Systems  Constitutional: Negative.   HENT: Negative.    Respiratory: Negative.  Negative for chest tightness, shortness of breath and wheezing.   Psychiatric/Behavioral: Negative.      Physical Exam  BP (!) 142/88 (BP Location: Right Arm, Patient Position: Sitting, Cuff Size: Normal)    Pulse (!) 58    Temp 98.4 F (36.9 C) (Oral)    Ht 6' (1.829 m)    Wt 282 lb (127.9 kg)    SpO2 98%    BMI 38.25 kg/m  Physical Exam Constitutional:      Appearance: Normal appearance.  HENT:     Head: Normocephalic and atraumatic.  Cardiovascular:  Rate and Rhythm: Normal rate and regular rhythm.  Pulmonary:     Effort: Pulmonary effort is normal.     Breath sounds: Normal breath sounds.  Musculoskeletal:        General: Normal range of motion.  Skin:    General: Skin is warm and dry.  Neurological:     General: No focal deficit present.     Mental Status: He is alert and oriented to person, place, and time. Mental status is at baseline.  Psychiatric:        Mood and Affect: Mood normal.        Behavior: Behavior normal.        Thought Content: Thought content normal.        Judgment: Judgment normal.     Lab Results:  CBC    Component Value Date/Time   WBC 6.1 05/17/2021 1159   RBC 4.87 05/17/2021 1159    HGB 15.3 05/17/2021 1159   HCT 43.5 05/17/2021 1159   PLT 194 05/17/2021 1159   MCV 89.3 05/17/2021 1159   MCH 31.4 05/17/2021 1159   MCHC 35.2 05/17/2021 1159   RDW 12.4 05/17/2021 1159    BMET    Component Value Date/Time   NA 139 05/17/2021 1159   NA 141 02/27/2020 0831   K 3.7 05/17/2021 1159   CL 102 05/17/2021 1159   CO2 28 05/17/2021 1159   GLUCOSE 90 05/17/2021 1159   BUN 15 05/17/2021 1159   BUN 15 02/27/2020 0831   CREATININE 1.11 05/17/2021 1159   CALCIUM 9.1 05/17/2021 1159   GFRNONAA >60 05/17/2021 1159   GFRAA 83 02/27/2020 0831    BNP No results found for: BNP  ProBNP No results found for: PROBNP  Imaging: CT ABDOMEN PELVIS W CONTRAST  Result Date: 05/17/2021 CLINICAL DATA:  Hernia, complicated EXAM: CT ABDOMEN AND PELVIS WITH CONTRAST TECHNIQUE: Multidetector CT imaging of the abdomen and pelvis was performed using the standard protocol following bolus administration of intravenous contrast. RADIATION DOSE REDUCTION: This exam was performed according to the departmental dose-optimization program which includes automated exposure control, adjustment of the mA and/or kV according to patient size and/or use of iterative reconstruction technique. CONTRAST:  100 mL Omnipaque 300 COMPARISON:  None. FINDINGS: Lower chest: No acute abnormality. Hepatobiliary: No focal liver abnormality is seen. No gallstones, gallbladder wall thickening, or biliary dilatation. Pancreas: Unremarkable. Spleen: Unremarkable. Adrenals/Urinary Tract: Adrenals are unremarkable. 2 mm nonobstructing calculi at the interpolar region and lower pole of the right kidney. Bladder is unremarkable though partially obscured by artifact. Stomach/Bowel: Stomach is within normal limits. Bowel is normal in caliber. Normal appendix. Vascular/Lymphatic: Minor calcified plaque.  No enlarged nodes. Reproductive: Obscured by artifact. Other: No free fluid. There is a wide neck fat containing umbilical hernia.  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 IMPRESSION: Wide neck, fat containing umbilical hernia. 2 small nonobstructing calculi of the right kidney. Electronically Signed   By: Macy Mis M.D.   On: 05/17/2021 14:34     Assessment & Plan:   Obstructive sleep apnea - Stable; Patient is 90% compliant with CPAP > 4 hours. Pressure 9cm h20, residual AHI 0.6/hr. No changes today. Encourage continued compliance wit CPAP 4-6 hours every night and weight loss efforts. Advised against driving if experience daytime sleepiness. DME company is choice medication, he does not need supplies renewed at this time. Follow-up in 1 year or sooner if needed.  Martyn Ehrich, NP 05/30/2021

## 2021-12-06 ENCOUNTER — Other Ambulatory Visit: Payer: Self-pay | Admitting: Orthopedic Surgery

## 2021-12-07 ENCOUNTER — Encounter (HOSPITAL_COMMUNITY): Payer: Self-pay

## 2021-12-07 NOTE — Progress Notes (Signed)
Surgery orders requested via Epic inbox. °

## 2021-12-07 NOTE — Patient Instructions (Addendum)
SURGICAL WAITING ROOM VISITATION Patients having surgery or a procedure may have no more than 2 support people in the waiting area - these visitors may rotate.   Children under the age of 18 must have an adult with them who is not the patient. If the patient needs to stay at the hospital during part of their recovery, the visitor guidelines for inpatient rooms apply. Pre-op nurse will coordinate an appropriate time for 1 support person to accompany patient in pre-op.  This support person may not rotate.    Please refer to the [facility] website for the visitor guidelines for Inpatients (after your surgery is over and you are in a regular room).      Your procedure is scheduled on: 12-22-21   Report to [facility] Main Entrance    Report to admitting at 5:15 AM   Call this number if you have problems the morning of surgery [phone]   Do not eat food :After Midnight.   After Midnight you may have the following liquids until 4:30 AM/ DAY OF SURGERY  Water Non-Citrus Juices (without pulp, NO RED) Carbonated Beverages Black Coffee (NO MILK/CREAM OR CREAMERS, sugar ok)  Clear Tea (NO MILK/CREAM OR CREAMERS, sugar ok) regular and decaf                             Plain Jell-O (NO RED)                                           Fruit ices (not with fruit pulp, NO RED)                                     Popsicles (NO RED)                                                               Sports drinks like Gatorade (NO RED)                   The day of surgery:  Drink ONE (1) Pre-Surgery Clear Ensure at 4:30 AM the morning of surgery. Drink in one sitting. Do not sip.  This drink was given to you during your hospital  pre-op appointment visit. Nothing else to drink after completing the Pre-Surgery Clear Ensure           If you have questions, please contact your surgeon's office.   FOLLOW  ANY ADDITIONAL PRE OP INSTRUCTIONS YOU RECEIVED FROM YOUR SURGEON'S OFFICE!!!      Oral Hygiene is also important to reduce your risk of infection.                                    Remember - BRUSH YOUR TEETH THE MORNING OF SURGERY WITH YOUR REGULAR TOOTHPASTE   Do NOT smoke after Midnight   Take these medicines the morning of surgery with A SIP OF WATER: Allopurinol, Rosuvastatin   Before surgery.Stop taking _ASA 81 mg__________on __________as  instructed by _____________.  Stop taking ____________as directed by your Surgeon/Cardiologist.  Contact your Surgeon/Cardiologist for instructions on Anticoagulant Therapy prior to surgery.    Bring CPAP mask and tubing day of surgery.                              You may not have any metal on your body including  jewelry, and body piercing             Do not wear  lotions, powders, cologne, or deodorant              Men may shave face and neck.   Do not bring valuables to the hospital. [place].   Contacts, dentures or bridgework may not be worn into surgery.   DO NOT [place].   Patients discharged on the day of surgery will not be allowed to drive home.  Someone NEEDS to stay with you for the first 24 hours after anesthesia.  Special Instructions: Bring a copy of your healthcare power of attorney and living will documents the day of surgery if you haven't scanned them before.  Please read over the following fact sheets you were given: IF YOU HAVE QUESTIONS ABOUT YOUR PRE-OP INSTRUCTIONS PLEASE CALL [facility]- Preparing for Total Shoulder Arthroplasty    Before surgery, you can play an important role. Because skin is not sterile, your skin needs to be as free of germs as possible. You can reduce the number of germs on your skin by using the following products. Benzoyl Peroxide Gel Reduces the number of germs present on the skin Applied twice a day to shoulder area starting two days before surgery     ==================================================================  Please follow these instructions carefully:  BENZOYL PEROXIDE 5% GEL  Please do not use if you have an allergy to benzoyl peroxide.   If your skin becomes reddened/irritated stop using the benzoyl peroxide.  Starting two days before surgery, apply as follows: Apply benzoyl peroxide in the morning and at night. Apply after taking a shower. If you are not taking a shower clean entire shoulder front, back, and side along with the armpit with a clean wet washcloth.  Place a quarter-sized dollop on your shoulder and rub in thoroughly, making sure to cover the front, back, and side of your shoulder, along with the armpit.   2 days before ____ AM   ____ PM              1 day before ____ AM   ____ PM                         Do this twice a day for two days.  (Last application is the night before surgery, AFTER using the CHG soap as described below).  Do NOT apply benzoyl peroxide gel on the day of surgery.   [facility] - Preparing for Surgery Before surgery, you can play an important role.  Because skin is not sterile, your skin needs to be as free of germs as possible.  You can reduce the number of germs on your skin by washing with CHG (chlorahexidine gluconate) soap before surgery.  CHG is an antiseptic cleaner which kills germs and bonds with the skin to continue killing germs even after washing. Please DO NOT use if you have an  allergy to CHG or antibacterial soaps.  If your skin becomes reddened/irritated stop using the CHG and inform your nurse when you arrive at Short Stay. You may shave your face/neck.  Please follow these instructions carefully:  1.  Shower with CHG Soap the night before surgery and the  morning of surgery.  2.  If you choose to wash your hair, wash your hair first as usual with your normal  shampoo.  3.  After you shampoo, rinse your hair and body thoroughly to remove the shampoo.                              4.  Use CHG as you would any other liquid soap.  You can apply chg directly to the skin and wash.  Gently with a scrungie or clean washcloth.  5.  Apply the CHG Soap to your body ONLY FROM THE NECK DOWN.   Do not use on face/ open                           Wound or open sores. Avoid contact with eyes, ears mouth and genitals (private parts).                       Wash face,  Genitals (private parts) with your normal soap.             6.  Wash thoroughly, paying special attention to the area where your surgery  will be performed.  7.  Thoroughly rinse your body with warm water from the neck down.  8.  DO NOT shower/wash with your normal soap after using and rinsing off the CHG Soap.                9.  Pat yourself dry with a clean towel.            10.  Wear clean pajamas.            11.  Place clean sheets on your bed the night of your first shower and do not  sleep with pets. Day of Surgery : Do not apply any lotions/deodorants the morning of surgery.  Please wear clean clothes to the hospital/surgery center.  FAILURE TO FOLLOW THESE INSTRUCTIONS MAY RESULT IN THE CANCELLATION OF YOUR SURGERY    ________________________________________________________________________

## 2021-12-09 ENCOUNTER — Other Ambulatory Visit: Payer: Self-pay

## 2021-12-09 ENCOUNTER — Ambulatory Visit (HOSPITAL_COMMUNITY)
Admission: RE | Admit: 2021-12-09 | Discharge: 2021-12-09 | Disposition: A | Discharge status: Home / Self Care | Payer: 59 | Source: Ambulatory Visit | Attending: Orthopedic Surgery | Admitting: Orthopedic Surgery

## 2021-12-09 ENCOUNTER — Encounter (HOSPITAL_COMMUNITY)
Admission: RE | Admit: 2021-12-09 | Discharge: 2021-12-09 | Disposition: A | Discharge status: Home / Self Care | Payer: 59 | Source: Ambulatory Visit | Attending: Orthopedic Surgery | Admitting: Orthopedic Surgery

## 2021-12-09 ENCOUNTER — Encounter (HOSPITAL_COMMUNITY): Payer: Self-pay

## 2021-12-09 VITALS — BP 136/91 | HR 60 | Temp 98.3°F | Resp 18 | Ht 71.0 in | Wt 264.0 lb

## 2021-12-09 DIAGNOSIS — Z01818 Encounter for other preprocedural examination: Secondary | ICD-10-CM | POA: Diagnosis present

## 2021-12-09 DIAGNOSIS — M75102 Unspecified rotator cuff tear or rupture of left shoulder, not specified as traumatic: Secondary | ICD-10-CM | POA: Diagnosis not present

## 2021-12-09 DIAGNOSIS — I2510 Atherosclerotic heart disease of native coronary artery without angina pectoris: Secondary | ICD-10-CM | POA: Insufficient documentation

## 2021-12-09 HISTORY — DX: Atherosclerotic heart disease of native coronary artery without angina pectoris: I25.10

## 2021-12-09 LAB — CBC
HCT: 44.1 % (ref 39.0–52.0)
Hemoglobin: 15.3 g/dL (ref 13.0–17.0)
MCH: 31.5 pg (ref 26.0–34.0)
MCHC: 34.7 g/dL (ref 30.0–36.0)
MCV: 90.7 fL (ref 80.0–100.0)
Platelets: 173 10*3/uL (ref 150–400)
RBC: 4.86 MIL/uL (ref 4.22–5.81)
RDW: 12.3 % (ref 11.5–15.5)
WBC: 5.2 10*3/uL (ref 4.0–10.5)
nRBC: 0 % (ref 0.0–0.2)

## 2021-12-09 LAB — BASIC METABOLIC PANEL
Anion gap: 6 (ref 5–15)
BUN: 20 mg/dL (ref 8–23)
CO2: 29 mmol/L (ref 22–32)
Calcium: 9.2 mg/dL (ref 8.9–10.3)
Chloride: 107 mmol/L (ref 98–111)
Creatinine, Ser: 1.07 mg/dL (ref 0.61–1.24)
GFR, Estimated: >60 mL/min (ref >60)
Glucose, Bld: 95 mg/dL (ref 70–99)
Potassium: 3.8 mmol/L (ref 3.5–5.1)
Sodium: 142 mmol/L (ref 135–145)

## 2021-12-09 LAB — SURGICAL PCR SCREEN
MRSA, PCR: NEGATIVE
Staphylococcus aureus: NEGATIVE

## 2021-12-09 NOTE — Progress Notes (Signed)
Anesthesia note:  Bowel prep reminder:NA  PCP - Dr. Margaretann Loveless Cardiologist -Dr. Marily Memos Other-   Chest x-ray - no EKG - 03/08/21-epic Stress Test - no ECHO - 10/08/19-epic Cardiac Cath - no  Pacemaker/ICD device last checked:NA  Sleep Study - yes CPAP -yes   Pt is pre diabetic-NA Fasting Blood Sugar -  Checks Blood Sugar _____  Blood Thinner:ASA 81 Blood Thinner Instructions: Aspirin Instructions:none Last Dose:12/16/21  Anesthesia review: yes  Patient denies shortness of breath, fever, cough and chest pain at PAT appointment Pt goes to the gym daily. No SOB with activities  Patient verbalized understanding of instructions that were given to them at the PAT appointment. Patient was also instructed that they will need to review over the PAT instructions again at home before surgery. yes

## 2021-12-14 NOTE — Progress Notes (Signed)
Anesthesia Chart Review   Case: [id number] Date/Time: 12/22/21 0715   Procedure: REVERSE SHOULDER ARTHROPLASTY (Left: Shoulder)   Anesthesia type: Choice   Pre-op diagnosis: LEFT SHOULDER ARTHRITIS WITH ROTATOR CUFF TEAR   Location: [facility] ROOM 07 / [facility]   Surgeons: Tania Ade, MD       DISCUSSION:66 y.o. never smoker with h/o PONV, OSA, CAD, left shoulder OA scheduled for above procedure 12/22/2021 with Dr. Tania Ade.   Seen by cardiology 03/08/2021, mild CAD on CCTA.   Anticipate pt can proceed with planned procedure barring acute status change.   VS: BP (!) 136/91   Pulse 60   Temp 36.8 C (Oral)   Resp 18   Ht '5\' 11"'$  (1.803 m)   Wt 119.7 kg   SpO2 96%   BMI 36.82 kg/m   PROVIDERS: Waldemar Dickens, MD is PCP    LABS: Labs reviewed: Acceptable for surgery. (all labs ordered are listed, but only abnormal results are displayed)  Labs Reviewed  SURGICAL PCR SCREEN  BASIC METABOLIC PANEL  CBC     IMAGES:   EKG:   CV: Echo 10/08/2019  1. Left ventricular ejection fraction, by estimation, is 60 to 65%. The  left ventricle has normal function. The left ventricle has no regional  wall motion abnormalities. There is mild left ventricular hypertrophy.  Left ventricular diastolic parameters  were normal.   2. Right ventricular systolic function is normal. The right ventricular  size is normal. Tricuspid regurgitation signal is inadequate for assessing  PA pressure.   3. The mitral valve is normal in structure. Trivial mitral valve  regurgitation. No evidence of mitral stenosis.   4. The aortic valve is grossly normal. Aortic valve regurgitation is not  visualized. No aortic stenosis, however suboptimal Doppler alignment  underestimates systolic gradient.   5. Aortic dilatation noted. 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  Past Medical History:  Diagnosis Date   Allergic rhinitis    Arthritis    hips, fingers   Cancer  ([place])    skin cancer   Coronary artery disease    GERD (gastroesophageal reflux disease)    occasional   Headache(784.0)    ocular migraines   OSA (obstructive sleep apnea)    w/ insomnia NPSG 02/08/10-AHI 6.2 hr   PONV (postoperative nausea and vomiting)     Past Surgical History:  Procedure Laterality Date   HERNIA REPAIR Left 2002   RETINAL DETACHMENT SURGERY Right 04/10/2005   x 2   right hip replaced  04/11/1999   SKIN CANCER EXCISION  1995   nose   TONSILLECTOMY     as a child   TOTAL HIP ARTHROPLASTY Left 01/15/2013   Procedure: LEFT TOTAL HIP ARTHROPLASTY ANTERIOR APPROACH;  Surgeon: Gearlean Alf, MD;  Location: [facility];  Service: Orthopedics;  Laterality: Left;    MEDICATIONS:  allopurinol (ZYLOPRIM) 300 MG tablet   aspirin EC 81 MG tablet   furosemide (LASIX) 20 MG tablet   Glucosamine-Chondroit-Vit C-Mn (GLUCOSAMINE-CHONDROITIN) TABS   meloxicam (MOBIC) 7.5 MG tablet   Multiple Vitamin (MULTIVITAMIN) tablet   NON FORMULARY   polyvinyl alcohol (LIQUIFILM TEARS) 1.4 % ophthalmic solution   rosuvastatin (CRESTOR) 20 MG tablet   terbinafine (LAMISIL) 250 MG tablet   No current facility-administered medications for this encounter.     Konrad Felix Ward, PA-C [facility] Pre-Surgical Testing [phone]

## 2021-12-15 ENCOUNTER — Telehealth: Payer: Self-pay | Admitting: Internal Medicine

## 2021-12-15 ENCOUNTER — Ambulatory Visit: Payer: 59 | Attending: Nurse Practitioner | Admitting: Nurse Practitioner

## 2021-12-15 ENCOUNTER — Telehealth: Payer: Self-pay | Admitting: *Deleted

## 2021-12-15 DIAGNOSIS — Z01810 Encounter for preprocedural cardiovascular examination: Secondary | ICD-10-CM | POA: Diagnosis not present

## 2021-12-15 NOTE — Telephone Encounter (Signed)
Primary Cardiologist:Gayatri Stann Mainland, MD   Preoperative team, please contact this patient and set up a phone call appointment for further preoperative risk assessment. Please obtain consent and complete medication review. Thank you for your help.   Aspirin may be held for 7 days prior to surgery. Please resume aspirin post operatively when it is felt to be safe from a bleeding standpoint.    Emmaline Life, NP-C     12/15/2021, 11:15 AM [address] Office [phone] Fax [phone]

## 2021-12-15 NOTE — Telephone Encounter (Signed)
   Pre-operative Risk Assessment    Patient Name: James Mack  DOB: Nov 23, 1955 MRN: [id number]      Request for Surgical Clearance    Procedure:  Left reverse total shoulder arthroplasty  Date of Surgery:  Clearance 12/22/21                                 Surgeon:  Dr. Tamera Punt Surgeon's Group or Practice Name:  [facility] Phone number:  [phone] Fax number:  [phone]   Type of Clearance Requested:   - Medical  - Pharmacy:  Hold Aspirin     Type of Anesthesia:   Choice   Additional requests/questions:      SignedErmelinda Das   12/15/2021, 11:04 AM

## 2021-12-15 NOTE — Telephone Encounter (Signed)
Pt agreeable to pre op appt add on today at 1:20. Med rec and consent are done.     Patient Consent for Virtual Visit        BURDETT PINZON has provided verbal consent on 12/15/2021 for a virtual visit (video or telephone).   CONSENT FOR VIRTUAL VISIT FOR:  Doy Hutching  By participating in this virtual visit I agree to the following:  I hereby voluntarily request, consent and authorize [facility] and its employed or contracted physicians, physician assistants, nurse practitioners or other licensed health care professionals (the Practitioner), to provide me with telemedicine health care services (the "Services") as deemed necessary by the treating Practitioner. I acknowledge and consent to receive the Services by the Practitioner via telemedicine. I understand that the telemedicine visit will involve communicating with the Practitioner through live audiovisual communication technology and the disclosure of certain medical information by electronic transmission. I acknowledge that I have been given the opportunity to request an in-person assessment or other available alternative prior to the telemedicine visit and am voluntarily participating in the telemedicine visit.  I understand that I have the right to withhold or withdraw my consent to the use of telemedicine in the course of my care at any time, without affecting my right to future care or treatment, and that the Practitioner or I may terminate the telemedicine visit at any time. I understand that I have the right to inspect all information obtained and/or recorded in the course of the telemedicine visit and may receive copies of available information for a reasonable fee.  I understand that some of the potential risks of receiving the Services via telemedicine include:  Delay or interruption in medical evaluation due to technological equipment failure or disruption; Information transmitted may not be sufficient (e.g. poor  resolution of images) to allow for appropriate medical decision making by the Practitioner; and/or  In rare instances, security protocols could fail, causing a breach of personal health information.  Furthermore, I acknowledge that it is my responsibility to provide information about my medical history, conditions and care that is complete and accurate to the best of my ability. I acknowledge that Practitioner's advice, recommendations, and/or decision may be based on factors not within their control, such as incomplete or inaccurate data provided by me or distortions of diagnostic images or specimens that may result from electronic transmissions. I understand that the practice of medicine is not an exact science and that Practitioner makes no warranties or guarantees regarding treatment outcomes. I acknowledge that a copy of this consent can be made available to me via my patient portal ([place]), or I can request a printed copy by calling the office of [facility].    I understand that my insurance will be billed for this visit.   I have read or had this consent read to me. I understand the contents of this consent, which adequately explains the benefits and risks of the Services being provided via telemedicine.  I have been provided ample opportunity to ask questions regarding this consent and the Services and have had my questions answered to my satisfaction. I give my informed consent for the services to be provided through the use of telemedicine in my medical care

## 2021-12-15 NOTE — Telephone Encounter (Signed)
Pt agreeable to pre op appt add on today at 1:20. Med rec and consent are done.

## 2021-12-15 NOTE — Progress Notes (Signed)
Virtual Visit via Telephone Note   Because of James Mack's co-morbid illnesses, he is at least at moderate risk for complications without adequate follow up.  This format is felt to be most appropriate for this patient at this time.  The patient did not have access to video technology/had technical difficulties with video requiring transitioning to audio format only (telephone).  All issues noted in this document were discussed and addressed.  No physical exam could be performed with this format.  Please refer to the patient's chart for his consent to telehealth for [facility].  Evaluation Performed:  Preoperative cardiovascular risk assessment _____________   Date:  12/15/2021   Patient ID:  James Mack, DOB 1955-08-09, MRN [id number] Patient Location:  Home Provider location:   Office  Primary Care Provider:  Waldemar Dickens, MD Primary Cardiologist:  Elouise Munroe, MD  Chief Complaint / Patient Profile   66 y.o. y/o male with a h/o OSA on CPAP, HLD, and mild CAD by CCTA, who is pending left reverse total shoulder arthroplasty and presents today for telephonic preoperative cardiovascular risk assessment.  Past Medical History    Past Medical History:  Diagnosis Date   Allergic rhinitis    Arthritis    hips, fingers   Cancer ([place])    skin cancer   Coronary artery disease    GERD (gastroesophageal reflux disease)    occasional   Headache(784.0)    ocular migraines   OSA (obstructive sleep apnea)    w/ insomnia NPSG 02/08/10-AHI 6.2 hr   PONV (postoperative nausea and vomiting)    Past Surgical History:  Procedure Laterality Date   HERNIA REPAIR Left 2002   RETINAL DETACHMENT SURGERY Right 04/10/2005   x 2   right hip replaced  04/11/1999   SKIN CANCER EXCISION  1995   nose   TONSILLECTOMY     as a child   TOTAL HIP ARTHROPLASTY Left 01/15/2013   Procedure: LEFT TOTAL HIP ARTHROPLASTY ANTERIOR APPROACH;  Surgeon: Gearlean Alf, MD;   Location: [facility];  Service: Orthopedics;  Laterality: Left;    Allergies  Allergies  Allergen Reactions   Shellfish Allergy Nausea And Vomiting    History of Present Illness    James Mack is a 66 y.o. male who presents via audio/video conferencing for a telehealth visit today.  Pt was last seen in cardiology clinic on 03/08/21 by Dr. Margaretann Loveless.  At that time FLOYD WADE was doing well.  The patient is now pending procedure as outlined above. Since his last visit, he denies chest pain, shortness of breath, lower extremity edema, fatigue, palpitations, melena, hematuria, hemoptysis, diaphoresis, weakness, presyncope, syncope, orthopnea, and PND. Bilateral LE edema    Home Medications    Prior to Admission medications   Medication Sig Start Date End Date Taking? Authorizing Provider  allopurinol (ZYLOPRIM) 300 MG tablet Take 300 mg by mouth daily.  06/24/11   [provider]  aspirin EC 81 MG tablet Take 81 mg by mouth daily. Swallow whole.    [provider]  furosemide (LASIX) 20 MG tablet Take 20 mg by mouth daily.    [provider]  Glucosamine-Chondroit-Vit C-Mn (GLUCOSAMINE-CHONDROITIN) TABS Take 1 tablet by mouth daily.    [provider]  meloxicam (MOBIC) 7.5 MG tablet Take 7.5 mg by mouth in the morning and at bedtime. 04/06/17   [provider]  Multiple Vitamin (MULTIVITAMIN) tablet Take 2 tablets by mouth daily.  [provider]  NON FORMULARY Pt uses a cpap nightly    [provider]  polyvinyl alcohol (LIQUIFILM TEARS) 1.4 % ophthalmic solution Place 1 drop into both eyes as needed for dry eyes.    [provider]  rosuvastatin (CRESTOR) 20 MG tablet Take 1 tablet (20 mg total) by mouth daily. Take 1 tablet (20 mg total) by mouth daily.Schedule office visit for future refills 03/08/21   Elouise Munroe, MD  terbinafine (LAMISIL) 250 MG tablet Take 250 mg by mouth daily. 06/27/19   [provider]    Physical Exam    Vital Signs:  Doy Hutching does not have vital signs available for review today.  Given telephonic nature of communication, physical exam is limited. AAOx3. NAD. Normal affect.  Speech and respirations are unlabored.  Accessory Clinical Findings    None  Assessment & Plan    1.  Preoperative Cardiovascular Risk Assessment: The patient is doing well from a cardiac perspective. Therefore, based on ACC/AHA guidelines, the patient would be at acceptable risk for the planned procedure without further cardiovascular testing. The patient was advised that if he develops new symptoms prior to surgery to contact our office to arrange for a follow-up visit, and he verbalized understanding. According to the Revised Cardiac Risk Index (RCRI), his Perioperative Risk of Major Cardiac Event is (%): 0.4. His Functional Capacity in METs is: 7.59 according to the Duke Activity Status Index (DASI).  Aspirin may be held for 7 days prior to surgery. Please resume aspirin post operatively when it is felt to be safe from a bleeding standpoint.   A copy of this note will be routed to requesting surgeon.  Time:   Today, I have spent 7 minutes with the patient with telehealth technology discussing medical history, symptoms, and management plan.     Emmaline Life, NP-C     12/15/2021, 12:00 PM [address], [address] Office [phone] Fax [phone]

## 2021-12-21 ENCOUNTER — Encounter (HOSPITAL_COMMUNITY): Payer: Self-pay | Admitting: Orthopedic Surgery

## 2021-12-21 NOTE — Anesthesia Preprocedure Evaluation (Addendum)
Anesthesia Evaluation  Patient identified by MRN, date of birth, ID band Patient awake    Reviewed: Allergy & Precautions, NPO status , Patient's Chart, lab work & pertinent test results, reviewed documented beta blocker date and time   History of Anesthesia Complications (+) PONV and history of anesthetic complications  Airway Mallampati: II  TM Distance: >3 FB Neck ROM: Full    Dental no notable dental hx. (+) Dental Advisory Given, Caps   Pulmonary asthma , sleep apnea and Continuous Positive Airway Pressure Ventilation ,    Pulmonary exam normal breath sounds clear to auscultation       Cardiovascular + CAD  Normal cardiovascular exam Rhythm:Regular Rate:Normal  EKG 03/08/21 SB otherwise Normal  Echo 09/2019 1. Left ventricular ejection fraction, by estimation, is 60 to 65%. The left ventricle has normal function. The left ventricle has no regional wall motion abnormalities. There is mild left ventricular hypertrophy. Left ventricular diastolic parameters were normal.  2. Right ventricular systolic function is normal. The right ventricular size is normal. Tricuspid regurgitation signal is inadequate for assessing PA pressure.  3. The mitral valve is normal in structure. Trivial mitral valve regurgitation. No evidence of mitral stenosis.  4. The aortic valve is grossly normal. Aortic valve regurgitation is not visualized. No aortic stenosis, however suboptimal Doppler alignment underestimates systolic gradient.  5. Aortic dilatation noted. There is mild dilatation of the ascending aorta measuring 41 mm.    Neuro/Psych  Headaches, negative psych ROS   GI/Hepatic Neg liver ROS, GERD  Medicated,  Endo/Other  negative endocrine ROS  Renal/GU negative Renal ROS  negative genitourinary   Musculoskeletal  (+) Arthritis , Osteoarthritis,  OA left shoulder with rotator cuff tear   Abdominal (+) + obese,   Peds   Hematology  (+) Blood dyscrasia, anemia ,   Anesthesia Other Findings   Reproductive/Obstetrics                           Anesthesia Physical Anesthesia Plan  ASA: 2  Anesthesia Plan: General   Post-op Pain Management: Regional block* and Minimal or no pain anticipated   Induction:   PONV Risk Score and Plan: 4 or greater and Treatment may vary due to age or medical condition, Midazolam, Scopolamine patch - Pre-op, Ondansetron and Dexamethasone  Airway Management Planned: Oral ETT  Additional Equipment: None  Intra-op Plan:   Post-operative Plan: Extubation in OR  Informed Consent: I have reviewed the patients History and Physical, chart, labs and discussed the procedure including the risks, benefits and alternatives for the proposed anesthesia with the patient or authorized representative who has indicated his/her understanding and acceptance.     Dental advisory given  Plan Discussed with: Anesthesiologist and CRNA  Anesthesia Plan Comments:        Anesthesia Quick Evaluation

## 2021-12-22 ENCOUNTER — Other Ambulatory Visit: Payer: Self-pay

## 2021-12-22 ENCOUNTER — Encounter (HOSPITAL_COMMUNITY): Payer: Self-pay | Admitting: Orthopedic Surgery

## 2021-12-22 ENCOUNTER — Encounter (HOSPITAL_COMMUNITY)
Admission: RE | Disposition: A | Discharge status: Home / Self Care | Payer: Self-pay | Source: Ambulatory Visit | Attending: Orthopedic Surgery

## 2021-12-22 ENCOUNTER — Ambulatory Visit (HOSPITAL_COMMUNITY)
Admission: RE | Admit: 2021-12-22 | Discharge: 2021-12-22 | Disposition: A | Discharge status: Home / Self Care | Payer: 59 | Source: Ambulatory Visit | Attending: Orthopedic Surgery | Admitting: Orthopedic Surgery

## 2021-12-22 ENCOUNTER — Ambulatory Visit (HOSPITAL_BASED_OUTPATIENT_CLINIC_OR_DEPARTMENT_OTHER): Payer: 59 | Admitting: Anesthesiology

## 2021-12-22 ENCOUNTER — Ambulatory Visit (HOSPITAL_COMMUNITY): Payer: 59 | Admitting: Physician Assistant

## 2021-12-22 DIAGNOSIS — M19012 Primary osteoarthritis, left shoulder: Secondary | ICD-10-CM

## 2021-12-22 DIAGNOSIS — K219 Gastro-esophageal reflux disease without esophagitis: Secondary | ICD-10-CM | POA: Insufficient documentation

## 2021-12-22 DIAGNOSIS — R519 Headache, unspecified: Secondary | ICD-10-CM | POA: Diagnosis not present

## 2021-12-22 DIAGNOSIS — Z9989 Dependence on other enabling machines and devices: Secondary | ICD-10-CM | POA: Diagnosis not present

## 2021-12-22 DIAGNOSIS — D649 Anemia, unspecified: Secondary | ICD-10-CM | POA: Insufficient documentation

## 2021-12-22 DIAGNOSIS — M75102 Unspecified rotator cuff tear or rupture of left shoulder, not specified as traumatic: Secondary | ICD-10-CM | POA: Insufficient documentation

## 2021-12-22 DIAGNOSIS — G4733 Obstructive sleep apnea (adult) (pediatric): Secondary | ICD-10-CM

## 2021-12-22 DIAGNOSIS — I2510 Atherosclerotic heart disease of native coronary artery without angina pectoris: Secondary | ICD-10-CM | POA: Diagnosis not present

## 2021-12-22 HISTORY — PX: REVERSE SHOULDER ARTHROPLASTY: SHX5054

## 2021-12-22 SURGERY — ARTHROPLASTY, SHOULDER, TOTAL, REVERSE
Anesthesia: General | Site: Shoulder | Laterality: Left

## 2021-12-22 MED ORDER — SODIUM CHLORIDE 0.9 % IR SOLN
Status: DC | PRN
  Administered 2021-12-22: 1000 mL

## 2021-12-22 MED ORDER — ASPIRIN EC 81 MG PO TBEC: 81.0000 mg | DELAYED_RELEASE_TABLET | Freq: Two times a day (BID) | ORAL | 11 refills | Status: AC

## 2021-12-22 MED ORDER — OXYCODONE HCL 5 MG PO TABS
5.0000 mg | ORAL_TABLET | Freq: Once | ORAL | Status: AC | PRN
  Administered 2021-12-22: 5 mg via ORAL

## 2021-12-22 MED ORDER — ROCURONIUM BROMIDE 10 MG/ML (PF) SYRINGE
PREFILLED_SYRINGE | INTRAVENOUS | Status: DC | PRN
  Administered 2021-12-22: 80 mg via INTRAVENOUS

## 2021-12-22 MED ORDER — HYDROMORPHONE HCL 1 MG/ML IJ SOLN: 0.2500 mg | INTRAMUSCULAR | Status: DC | PRN

## 2021-12-22 MED ORDER — WATER FOR IRRIGATION, STERILE IR SOLN
Status: DC | PRN
  Administered 2021-12-22: 2000 mL

## 2021-12-22 MED ORDER — LACTATED RINGERS IV SOLN: INTRAVENOUS | Status: DC

## 2021-12-22 MED ORDER — TRANEXAMIC ACID-NACL 1000-0.7 MG/100ML-% IV SOLN
1000.0000 mg | INTRAVENOUS | Status: DC
  Filled 2021-12-22: qty 100

## 2021-12-22 MED ORDER — ONDANSETRON HCL 4 MG/2ML IJ SOLN
INTRAMUSCULAR | Status: AC
  Filled 2021-12-22: qty 2

## 2021-12-22 MED ORDER — PHENYLEPHRINE HCL-NACL 20-0.9 MG/250ML-% IV SOLN
INTRAVENOUS | Status: AC
  Filled 2021-12-22: qty 250

## 2021-12-22 MED ORDER — AMISULPRIDE (ANTIEMETIC) 5 MG/2ML IV SOLN: 10.0000 mg | Freq: Once | INTRAVENOUS | Status: DC | PRN

## 2021-12-22 MED ORDER — PROPOFOL 10 MG/ML IV BOLUS
INTRAVENOUS | Status: DC | PRN
  Administered 2021-12-22: 180 mg via INTRAVENOUS

## 2021-12-22 MED ORDER — 0.9 % SODIUM CHLORIDE (POUR BTL) OPTIME
TOPICAL | Status: DC | PRN
  Administered 2021-12-22: 1000 mL

## 2021-12-22 MED ORDER — CHLORHEXIDINE GLUCONATE 0.12 % MT SOLN
15.0000 mL | Freq: Once | OROMUCOSAL | Status: AC
  Administered 2021-12-22: 15 mL via OROMUCOSAL

## 2021-12-22 MED ORDER — SUGAMMADEX SODIUM 200 MG/2ML IV SOLN
INTRAVENOUS | Status: DC | PRN
  Administered 2021-12-22: 100 mg via INTRAVENOUS
  Administered 2021-12-22: 200 mg via INTRAVENOUS

## 2021-12-22 MED ORDER — ROCURONIUM BROMIDE 10 MG/ML (PF) SYRINGE
PREFILLED_SYRINGE | INTRAVENOUS | Status: AC
  Filled 2021-12-22: qty 10

## 2021-12-22 MED ORDER — ONDANSETRON HCL 4 MG PO TABS
4.0000 mg | ORAL_TABLET | Freq: Four times a day (QID) | ORAL | Status: DC | PRN
  Filled 2021-12-22: qty 1

## 2021-12-22 MED ORDER — ONDANSETRON HCL 4 MG/2ML IJ SOLN: 4.0000 mg | Freq: Once | INTRAMUSCULAR | Status: DC | PRN

## 2021-12-22 MED ORDER — HYDROMORPHONE HCL 1 MG/ML IJ SOLN: 0.5000 mg | INTRAMUSCULAR | Status: DC | PRN

## 2021-12-22 MED ORDER — LIDOCAINE HCL (PF) 2 % IJ SOLN
INTRAMUSCULAR | Status: AC
  Filled 2021-12-22: qty 5

## 2021-12-22 MED ORDER — ORAL CARE MOUTH RINSE: 15.0000 mL | Freq: Once | OROMUCOSAL | Status: AC

## 2021-12-22 MED ORDER — METOCLOPRAMIDE HCL 5 MG PO TABS
5.0000 mg | ORAL_TABLET | Freq: Three times a day (TID) | ORAL | Status: DC | PRN
  Filled 2021-12-22: qty 2

## 2021-12-22 MED ORDER — ONDANSETRON HCL 4 MG/2ML IJ SOLN: 4.0000 mg | Freq: Four times a day (QID) | INTRAMUSCULAR | Status: DC | PRN

## 2021-12-22 MED ORDER — PHENYLEPHRINE HCL-NACL 20-0.9 MG/250ML-% IV SOLN
INTRAVENOUS | Status: DC | PRN
  Administered 2021-12-22: 50 ug/min via INTRAVENOUS

## 2021-12-22 MED ORDER — BUPIVACAINE LIPOSOME 1.3 % IJ SUSP
INTRAMUSCULAR | Status: DC | PRN
  Administered 2021-12-22: 10 mL via PERINEURAL

## 2021-12-22 MED ORDER — ONDANSETRON HCL 4 MG/2ML IJ SOLN
INTRAMUSCULAR | Status: DC | PRN
  Administered 2021-12-22: 4 mg via INTRAVENOUS

## 2021-12-22 MED ORDER — MIDAZOLAM HCL 2 MG/2ML IJ SOLN
INTRAMUSCULAR | Status: AC
  Filled 2021-12-22: qty 2

## 2021-12-22 MED ORDER — OXYCODONE HCL 5 MG PO TABS
ORAL_TABLET | ORAL | Status: AC
  Filled 2021-12-22: qty 1

## 2021-12-22 MED ORDER — OXYCODONE HCL 5 MG PO TABS: 5.0000 mg | ORAL_TABLET | ORAL | Status: DC | PRN

## 2021-12-22 MED ORDER — CEFAZOLIN SODIUM-DEXTROSE 2-4 GM/100ML-% IV SOLN
2.0000 g | INTRAVENOUS | Status: AC
  Administered 2021-12-22: 2 g via INTRAVENOUS
  Filled 2021-12-22: qty 100

## 2021-12-22 MED ORDER — GLYCOPYRROLATE 0.2 MG/ML IJ SOLN
INTRAMUSCULAR | Status: DC | PRN
  Administered 2021-12-22: .2 mg via INTRAVENOUS

## 2021-12-22 MED ORDER — MIDAZOLAM HCL 5 MG/5ML IJ SOLN
INTRAMUSCULAR | Status: DC | PRN
  Administered 2021-12-22: 2 mg via INTRAVENOUS

## 2021-12-22 MED ORDER — OXYCODONE HCL 5 MG/5ML PO SOLN: 5.0000 mg | Freq: Once | ORAL | Status: AC | PRN

## 2021-12-22 MED ORDER — FENTANYL CITRATE (PF) 100 MCG/2ML IJ SOLN
INTRAMUSCULAR | Status: DC | PRN
  Administered 2021-12-22: 100 ug via INTRAVENOUS

## 2021-12-22 MED ORDER — LIDOCAINE 2% (20 MG/ML) 5 ML SYRINGE
INTRAMUSCULAR | Status: DC | PRN
  Administered 2021-12-22: 100 mg via INTRAVENOUS

## 2021-12-22 MED ORDER — ACETAMINOPHEN 500 MG PO TABS: 1000.0000 mg | ORAL_TABLET | Freq: Four times a day (QID) | ORAL | Status: DC

## 2021-12-22 MED ORDER — PROPOFOL 10 MG/ML IV BOLUS
INTRAVENOUS | Status: AC
  Filled 2021-12-22: qty 20

## 2021-12-22 MED ORDER — DEXAMETHASONE SODIUM PHOSPHATE 10 MG/ML IJ SOLN
INTRAMUSCULAR | Status: AC
  Filled 2021-12-22: qty 1

## 2021-12-22 MED ORDER — FENTANYL CITRATE (PF) 100 MCG/2ML IJ SOLN
INTRAMUSCULAR | Status: AC
  Filled 2021-12-22: qty 2

## 2021-12-22 MED ORDER — METHOCARBAMOL 500 MG PO TABS
ORAL_TABLET | ORAL | Status: AC
  Filled 2021-12-22: qty 1

## 2021-12-22 MED ORDER — BUPIVACAINE HCL (PF) 0.5 % IJ SOLN
INTRAMUSCULAR | Status: DC | PRN
  Administered 2021-12-22: 20 mL via PERINEURAL

## 2021-12-22 MED ORDER — SCOPOLAMINE 1 MG/3DAYS TD PT72
MEDICATED_PATCH | TRANSDERMAL | Status: AC
  Filled 2021-12-22: qty 1

## 2021-12-22 MED ORDER — GLYCOPYRROLATE 0.2 MG/ML IJ SOLN
INTRAMUSCULAR | Status: AC
  Filled 2021-12-22: qty 1

## 2021-12-22 MED ORDER — METOCLOPRAMIDE HCL 5 MG/ML IJ SOLN: 5.0000 mg | Freq: Three times a day (TID) | INTRAMUSCULAR | Status: DC | PRN

## 2021-12-22 MED ORDER — METHOCARBAMOL 500 MG PO TABS
500.0000 mg | ORAL_TABLET | Freq: Four times a day (QID) | ORAL | Status: DC | PRN
  Administered 2021-12-22: 500 mg via ORAL

## 2021-12-22 MED ORDER — DEXAMETHASONE SODIUM PHOSPHATE 10 MG/ML IJ SOLN
INTRAMUSCULAR | Status: DC | PRN
  Administered 2021-12-22: 10 mg via INTRAVENOUS

## 2021-12-22 MED ORDER — OXYCODONE-ACETAMINOPHEN 5-325 MG PO TABS: 1.0000 | ORAL_TABLET | ORAL | 0 refills | Status: AC | PRN

## 2021-12-22 MED ORDER — SCOPOLAMINE 1 MG/3DAYS TD PT72
MEDICATED_PATCH | TRANSDERMAL | Status: DC | PRN
  Administered 2021-12-22: 1 via TRANSDERMAL

## 2021-12-22 MED ORDER — LACTATED RINGERS IV BOLUS
500.0000 mL | Freq: Once | INTRAVENOUS | Status: AC
  Administered 2021-12-22: 500 mL via INTRAVENOUS

## 2021-12-22 MED ORDER — ACETAMINOPHEN 325 MG PO TABS: 325.0000 mg | ORAL_TABLET | Freq: Four times a day (QID) | ORAL | Status: DC | PRN

## 2021-12-22 MED ORDER — METHOCARBAMOL 500 MG IVPB - SIMPLE MED: 500.0000 mg | Freq: Four times a day (QID) | INTRAVENOUS | Status: DC | PRN

## 2021-12-22 SURGICAL SUPPLY — 81 items
BAG COUNTER SPONGE SURGICOUNT (BAG) IMPLANT
BAG SPEC THK2 15X12 ZIP CLS (MISCELLANEOUS) ×1
BAG SPNG CNTER NS LX DISP (BAG)
BAG ZIPLOCK 12X15 (MISCELLANEOUS) ×1 IMPLANT
BASEPLATE P2 COATD GLND 6.5X30 (Shoulder) IMPLANT
BIT DRILL 1.6MX128 (BIT) IMPLANT
BIT DRILL 2.5 DIA 127 CALI (BIT) IMPLANT
BIT DRILL 4 DIA CALIBRATED (BIT) IMPLANT
BLADE SAW SGTL 73X25 THK (BLADE) ×1 IMPLANT
BOOTIES KNEE HIGH SLOAN (MISCELLANEOUS) ×2 IMPLANT
BSPLAT GLND 30 STRL LF SHLDR (Shoulder) ×1 IMPLANT
COOLER ICEMAN CLASSIC (MISCELLANEOUS) IMPLANT
COVER BACK TABLE 60X90IN (DRAPES) ×1 IMPLANT
COVER SURGICAL LIGHT HANDLE (MISCELLANEOUS) ×1 IMPLANT
DRAPE INCISE IOBAN 66X45 STRL (DRAPES) ×1 IMPLANT
DRAPE ORTHO SPLIT 77X108 STRL (DRAPES) ×2
DRAPE POUCH INSTRU U-SHP 10X18 (DRAPES) ×1 IMPLANT
DRAPE SHEET LG 3/4 BI-LAMINATE (DRAPES) ×1 IMPLANT
DRAPE SURG 17X11 SM STRL (DRAPES) ×1 IMPLANT
DRAPE SURG ORHT 6 SPLT 77X108 (DRAPES) ×2 IMPLANT
DRAPE TOP 10253 STERILE (DRAPES) ×1 IMPLANT
DRAPE U-SHAPE 47X51 STRL (DRAPES) ×1 IMPLANT
DRSG AQUACEL AG ADV 3.5X 6 (GAUZE/BANDAGES/DRESSINGS) ×1 IMPLANT
DURAPREP 26ML APPLICATOR (WOUND CARE) ×2 IMPLANT
ELECT BLADE TIP CTD 4 INCH (ELECTRODE) ×1 IMPLANT
ELECT REM PT RETURN 15FT ADLT (MISCELLANEOUS) ×1 IMPLANT
FACESHIELD WRAPAROUND (MASK) ×1 IMPLANT
FACESHIELD WRAPAROUND OR TEAM (MASK) ×1 IMPLANT
GLOVE BIO SURGEON STRL SZ7.5 (GLOVE) ×1 IMPLANT
GLOVE BIOGEL PI IND STRL 6.5 (GLOVE) ×1 IMPLANT
GLOVE BIOGEL PI IND STRL 8 (GLOVE) ×1 IMPLANT
GLOVE SURG SS PI 6.5 STRL IVOR (GLOVE) ×1 IMPLANT
GOWN STRL REUS W/ TWL LRG LVL3 (GOWN DISPOSABLE) ×1 IMPLANT
GOWN STRL REUS W/ TWL XL LVL3 (GOWN DISPOSABLE) ×1 IMPLANT
GOWN STRL REUS W/TWL LRG LVL3 (GOWN DISPOSABLE) ×1
GOWN STRL REUS W/TWL XL LVL3 (GOWN DISPOSABLE) ×1
HANDPIECE INTERPULSE COAX TIP (DISPOSABLE) ×1
HOOD PEEL AWAY FLYTE STAYCOOL (MISCELLANEOUS) ×3 IMPLANT
INSERT EPOLY STND HUMERUS+4 32 (Shoulder) ×1 IMPLANT
INSERT EPOLYSTD HUMERUS+4 32 (Shoulder) IMPLANT
KIT BASIN OR (CUSTOM PROCEDURE TRAY) ×1 IMPLANT
KIT TURNOVER KIT A (KITS) IMPLANT
MANIFOLD NEPTUNE II (INSTRUMENTS) ×1 IMPLANT
NDL MA TROC 1/2 CIR (NEEDLE) IMPLANT
NDL TROCAR POINT SZ 2 1/2 (NEEDLE) IMPLANT
NEEDLE MA TROC 1/2 CIR (NEEDLE) ×1 IMPLANT
NEEDLE TROCAR POINT SZ 2 1/2 (NEEDLE) IMPLANT
NS IRRIG 1000ML POUR BTL (IV SOLUTION) ×1 IMPLANT
P2 COATDE GLNOID BSEPLT 6.5X30 (Shoulder) ×1 IMPLANT
PACK SHOULDER (CUSTOM PROCEDURE TRAY) ×1 IMPLANT
PAD COLD SHLDR WRAP-ON (PAD) IMPLANT
PROTECTOR NERVE ULNAR (MISCELLANEOUS) IMPLANT
RESTRAINT HEAD UNIVERSAL NS (MISCELLANEOUS) ×1 IMPLANT
RETRIEVER SUT HEWSON (MISCELLANEOUS) IMPLANT
SCREW BONE LOCKING RSP 5.0X30 (Screw) ×1 IMPLANT
SCREW BONE LOCKING RSP 5.0X34 (Screw) ×1 IMPLANT
SCREW BONE RSP LOCK 5X18 (Screw) IMPLANT
SCREW BONE RSP LOCK 5X30 (Screw) IMPLANT
SCREW BONE RSP LOCK 5X34 (Screw) IMPLANT
SCREW BONE RSP LOCKING 18MM LG (Screw) ×2 IMPLANT
SCREW RETAIN W/HEAD 32MM (Shoulder) IMPLANT
SET HNDPC FAN SPRY TIP SCT (DISPOSABLE) ×1 IMPLANT
SLING ARM IMMOBILIZER LRG (SOFTGOODS) IMPLANT
SLING ARM IMMOBILIZER MED (SOFTGOODS) IMPLANT
STEM HUMERAL STD SHELL 10X48 (Stem) IMPLANT
STRIP CLOSURE SKIN 1/2X4 (GAUZE/BANDAGES/DRESSINGS) ×2 IMPLANT
SUCTION FRAZIER HANDLE 10FR (MISCELLANEOUS)
SUCTION TUBE FRAZIER 10FR DISP (MISCELLANEOUS) IMPLANT
SUPPORT WRAP ARM LG (MISCELLANEOUS) IMPLANT
SUT ETHIBOND 2 V 37 (SUTURE) IMPLANT
SUT FIBERWIRE #2 38 REV NDL BL (SUTURE)
SUT MNCRL AB 4-0 PS2 18 (SUTURE) ×1 IMPLANT
SUT VIC AB 2-0 CT1 27 (SUTURE) ×2
SUT VIC AB 2-0 CT1 TAPERPNT 27 (SUTURE) ×2 IMPLANT
SUTURE FIBERWR#2 38 REV NDL BL (SUTURE) IMPLANT
TAPE LABRALWHITE 1.5X36 (TAPE) IMPLANT
TAPE STRIPS DRAPE STRL (GAUZE/BANDAGES/DRESSINGS) IMPLANT
TAPE SUT LABRALTAP WHT/BLK (SUTURE) IMPLANT
TOWEL OR 17X26 10 PK STRL BLUE (TOWEL DISPOSABLE) ×1 IMPLANT
TOWEL OR NON WOVEN STRL DISP B (DISPOSABLE) ×1 IMPLANT
WATER STERILE IRR 1000ML POUR (IV SOLUTION) ×1 IMPLANT

## 2021-12-22 NOTE — Discharge Instructions (Signed)
Discharge Instructions after Reverse Total Shoulder Arthroplasty   . A sling has been provided for you. You are to wear this at all times (except for bathing and dressing), until your first post operative visit with Dr. Rauf. Please also wear while sleeping at night. While you bath and dress, let the arm/elbow extend straight down to stretch your elbow. Wiggle your fingers and pump your first while your in the sling to prevent hand swelling. . Use ice on the shoulder intermittently over the first 48 hours after surgery. Continue to use ice or and ice machine as needed after 48 hours for pain control/swelling.  . Pain medicine has been prescribed for you.  . Use your medicine liberally over the first 48 hours, and then you can begin to taper your use. You may take Extra Strength Tylenol or Tylenol only in place of the pain pills. DO NOT take ANY nonsteroidal anti-inflammatory pain medications: Advil, Motrin, Ibuprofen, Aleve, Naproxen or Naprosyn.  . Take one aspirin a day for 2 weeks after surgery, unless you have an aspirin sensitivity/allergy or asthma.  . Leave your dressing on until your first follow up visit.  You may shower with the dressing.  Hold your arm as if you still have your sling on while you shower. . Simply allow the water to wash over the site and then pat dry. Make sure your axilla (armpit) is completely dry after showering.    Please call [phone] during normal business hours or [phone] after hours for any problems. Including the following:  - excessive redness of the incisions - drainage for more than 4 days - fever of more than 101.5 F  *Please note that pain medications will not be refilled after hours or on weekends.     

## 2021-12-22 NOTE — Anesthesia Procedure Notes (Signed)
Procedure Name: Intubation Date/Time: 12/22/2021 7:30 AM  Performed by: Lind Covert, CRNAPre-anesthesia Checklist: Patient identified, Emergency Drugs available, Suction available, Patient being monitored and Timeout performed Patient Re-evaluated:Patient Re-evaluated prior to induction Oxygen Delivery Method: Circle system utilized Preoxygenation: Pre-oxygenation with 100% oxygen Induction Type: IV induction Ventilation: Mask ventilation without difficulty Laryngoscope Size: Mac and 4 Grade View: Grade II Tube type: Oral Tube size: 8.0 mm Number of attempts: 1 Airway Equipment and Method: Stylet Placement Confirmation: ETT inserted through vocal cords under direct vision, CO2 detector and breath sounds checked- equal and bilateral Secured at: 23 cm Tube secured with: Tape Dental Injury: Teeth and Oropharynx as per pre-operative assessment

## 2021-12-22 NOTE — Anesthesia Postprocedure Evaluation (Signed)
Anesthesia Post Note  Patient: James Mack  Procedure(s) Performed: REVERSE SHOULDER ARTHROPLASTY (Left: Shoulder)     Patient location during evaluation: PACU Anesthesia Type: General Level of consciousness: awake and alert and oriented Pain management: pain level controlled Vital Signs Assessment: post-procedure vital signs reviewed and stable Respiratory status: spontaneous breathing, nonlabored ventilation and respiratory function stable Cardiovascular status: blood pressure returned to baseline and stable Postop Assessment: no apparent nausea or vomiting Anesthetic complications: no   No notable events documented.  Last Vitals:  Vitals:   12/22/21 0918 12/22/21 0930  BP: (!) 151/90 (!) 149/74  Pulse: (!) 58 65  Resp: 20 18  Temp:    SpO2: 95% 94%    Last Pain:  Vitals:   12/22/21 0945  TempSrc:   PainSc: 0-No pain                 Kyngston Pickelsimer A.

## 2021-12-22 NOTE — H&P (Signed)
James Mack is an 66 y.o. male.   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  Pain interferes with sleep and quality of life.    Past Medical History:  Diagnosis Date   Allergic rhinitis    Arthritis    hips, fingers   Cancer ([place])    skin cancer   Coronary artery disease    GERD (gastroesophageal reflux disease)    occasional   Headache(784.0)    ocular migraines   OSA (obstructive sleep apnea)    w/ insomnia NPSG 02/08/10-AHI 6.2 hr   PONV (postoperative nausea and vomiting)     Past Surgical History:  Procedure Laterality Date   HERNIA REPAIR Left 2002   RETINAL DETACHMENT SURGERY Right 04/10/2005   x 2   right hip replaced  04/11/1999   SKIN CANCER EXCISION  1995   nose   TONSILLECTOMY     as a child   TOTAL HIP ARTHROPLASTY Left 01/15/2013   Procedure: LEFT TOTAL HIP ARTHROPLASTY ANTERIOR APPROACH;  Surgeon: Gearlean Alf, MD;  Location: [facility];  Service: Orthopedics;  Laterality: Left;    Family History  Problem Relation Age of Onset   Breast cancer Mother    Sleep apnea Sister    Cancer Sister    Social History:  reports that he has never smoked. He has never used smokeless tobacco. He reports that he does not drink alcohol and does not use drugs.  Allergies:  Allergies  Allergen Reactions   Shellfish Allergy Nausea And Vomiting   Soybean-Containing Drug Products Diarrhea   Sunflower Oil Swelling    Medications Prior to Admission  Medication Sig Dispense Refill   allopurinol (ZYLOPRIM) 300 MG tablet Take 300 mg by mouth daily.      aspirin EC 81 MG tablet Take 81 mg by mouth daily. Swallow whole.     furosemide (LASIX) 20 MG tablet Take 20 mg by mouth daily.     Glucosamine-Chondroit-Vit C-Mn (GLUCOSAMINE-CHONDROITIN) TABS Take 1 tablet by mouth daily.     meloxicam (MOBIC) 7.5 MG tablet Take 7.5 mg by mouth  in the morning and at bedtime.  3   Multiple Vitamin (MULTIVITAMIN) tablet Take 2 tablets by mouth daily.     NON FORMULARY Pt uses a cpap nightly     polyvinyl alcohol (LIQUIFILM TEARS) 1.4 % ophthalmic solution Place 1 drop into both eyes as needed for dry eyes.     rosuvastatin (CRESTOR) 20 MG tablet Take 1 tablet (20 mg total) by mouth daily. Take 1 tablet (20 mg total) by mouth daily.Schedule office visit for future refills 90 tablet 3   terbinafine (LAMISIL) 250 MG tablet Take 250 mg by mouth daily.      No results found for this or any previous visit (from the past 48 hour(s)). No results found.  Review of Systems  All other systems reviewed and are negative.   Blood pressure (!) 157/90, pulse 65, temperature 98 F (36.7 C), temperature source Oral, resp. rate 16, height '5\' 11"'$  (1.803 m), weight 119.7 kg, SpO2 99 %. Physical Exam Constitutional:      Appearance: He is well-developed.  HENT:     Head: Atraumatic.  Eyes:     Extraocular Movements: Extraocular movements intact.  Cardiovascular:     Pulses: Normal pulses.  Pulmonary:     Effort: Pulmonary effort is normal.  Musculoskeletal:  Comments: L shoulder pain with limited ROM. NVD  Skin:    General: Skin is warm and dry.  Neurological:     Mental Status: He is alert and oriented to person, place, and time.  Psychiatric:        Mood and Affect: Mood normal.      Assessment/Plan L shoulder endstage arthritis with sig rotator cuff pathology Plan L reverse TSA Risks / benefits of surgery discussed Consent on chart  NPO for OR Preop antibiotics   Rhae Hammock, MD 12/22/2021, 7:12 AM

## 2021-12-22 NOTE — Transfer of Care (Signed)
Immediate Anesthesia Transfer of Care Note  Patient: James Mack  Procedure(s) Performed: REVERSE SHOULDER ARTHROPLASTY (Left: Shoulder)  Patient Location: PACU  Anesthesia Type:General  Level of Consciousness: sedated  Airway & Oxygen Therapy: Patient Spontanous Breathing and Patient connected to face mask oxygen  Post-op Assessment: Report given to RN and Post -op Vital signs reviewed and stable  Post vital signs: Reviewed and stable  Last Vitals:  Vitals Value Taken Time  BP 151/90 12/22/21 0915  Temp    Pulse 58 12/22/21 0917  Resp 20 12/22/21 0917  SpO2 95 % 12/22/21 0917  Vitals shown include unvalidated device data.  Last Pain:  Vitals:   12/22/21 0548  TempSrc: Oral         Complications: No notable events documented.

## 2021-12-22 NOTE — Op Note (Signed)
Procedure(s): REVERSE SHOULDER ARTHROPLASTY Procedure Note  BABYBOY LOYA male 66 y.o. 12/22/2021  Preoperative diagnosis: Left shoulder end-stage osteoarthritis with high-grade rotator cuff tear  Postoperative diagnosis: Same  Procedure(s) and Anesthesia Type:    * REVERSE SHOULDER ARTHROPLASTY - Choice   Indications:  66 y.o. male  With endstage left shoulder arthritis with rotator cuff tear. Pain and dysfunction interfered with quality of life and nonoperative treatment with activity modification, NSAIDS and injections failed.     Surgeon: Rhae Hammock   Assistants: Nehemiah Massed PA-C was present and scrubbed throughout the procedure and was essential in positioning, retraction, exposure, and closure)  Anesthesia: General endotracheal anesthesia with preoperative interscalene block given by the attending anesthesiologist    Procedure Detail  REVERSE SHOULDER ARTHROPLASTY   Estimated Blood Loss:  200 mL         Drains: none  Blood Given: none          Specimens: none        Complications:  * No complications entered in OR log *         Disposition: PACU - hemodynamically stable.         Condition: stable      OPERATIVE FINDINGS:  A DJO Altivate pressfit reverse total shoulder arthroplasty was placed with a  size 10 stem, a 32 standard glenosphere, and a +4-mm poly insert. The base plate  fixation was excellent.  PROCEDURE: The patient was identified in the preoperative holding area  where I personally marked the operative site after verifying site, side,  and procedure with the patient. An interscalene block given by  the attending anesthesiologist in the holding area and the patient was taken back to the operating room where all extremities were  carefully padded in position after general anesthesia was induced. She  was placed in a beach-chair position and the operative upper extremity was  prepped and draped in a standard sterile fashion. An  approximately 10-  cm incision was made from the tip of the coracoid process to the center  point of the humerus at the level of the axilla. Dissection was carried  down through subcutaneous tissues to the level of the cephalic vein  which was taken laterally with the deltoid. The pectoralis major was  retracted medially. The subdeltoid space was developed and the lateral  edge of the conjoined tendon was identified. The undersurface of  conjoined tendon was palpated and the musculocutaneous nerve was not in  the field. Retractor was placed underneath the conjoined and second  retractor was placed lateral into the deltoid. The circumflex humeral  artery and vessels were identified and clamped and coagulated. The  biceps tendon was tenodesed to the upper border of the pectoralis major.  The subscapularis was taken down as a peel.  The  joint was then gently externally rotated while the capsule was released  from the humeral neck around to just beyond the 6 o'clock position. At  this point, the joint was dislocated and the humeral head was presented  into the wound. The excessive osteophyte formation was removed with a  large rongeur.  The cutting guide was used to make the appropriate  head cut and the head was saved for potentially bone grafting.  The glenoid was exposed with the arm in an  abducted extended position. The anterior and posterior labrum were  completely excised and the capsule was released circumferentially to  allow for exposure of the glenoid for preparation. The 2.5 mm  drill was  placed using the guide in 5-10 inferior angulation and the tap was then advanced in the same hole. Small and large reamers were then used. The tap was then removed and the Metaglene was then screwed in with excellent purchase.  The peripheral guide was then used to drilled measured and filled peripheral locking screws. The size 32 standard glenosphere was then impacted on the [facility] taper and the  central screw was placed. The humerus was then again exposed and the diaphyseal reamers were used followed by the metaphyseal reamers. The final broach was left in place in the proximal trial was placed. The joint was reduced and with this implant it was felt that soft tissue tensioning was appropriate with excellent stability and excellent range of motion. Therefore, final humeral stem was placed press-fit.  And then the trial polyethylene inserts were tested again and the above implant was felt to be the most appropriate for final insertion. The joint was reduced taken through full range of motion and felt to be stable. Soft tissue tension was appropriate.  The joint was then copiously irrigated with pulse  lavage and the wound was then closed. The subscapularis was repaired with labral tapes through bone tunnels.  Skin was closed with 2-0 Vicryl in a deep dermal layer and 4-0  Monocryl for skin closure. Steri-Strips were applied. Sterile  dressings were then applied as well as a sling. The patient was allowed  to awaken from general anesthesia, transferred to stretcher, and taken  to recovery room in stable condition.   POSTOPERATIVE PLAN: The patient will be observed in the recovery room and if his pain is well controlled with the regional block and he is hemodynamically stable he could be discharged home today with family.

## 2021-12-22 NOTE — Anesthesia Procedure Notes (Signed)
Anesthesia Regional Block: Interscalene brachial plexus block   Pre-Anesthetic Checklist: , timeout performed,  Correct Patient, Correct Site, Correct Laterality,  Correct Procedure, Correct Position, site marked,  Risks and benefits discussed,  Surgical consent,  Pre-op evaluation,  At surgeon's request and post-op pain management  Laterality: Left  Prep: chloraprep       Needles:  Injection technique: Single-shot  Needle Type: Echogenic Stimulator Needle     Needle Length: 10cm  Needle Gauge: 21   Needle insertion depth: 7 cm   Additional Needles:   Procedures:,,,, ultrasound used (permanent image in chart),,   Motor weakness within 15 minutes.  Narrative:  Start time: 12/22/2021 6:50 AM End time: 12/22/2021 6:55 AM Injection made incrementally with aspirations every 5 mL.  Performed by: Personally  Anesthesiologist: Josephine Igo, MD  Additional Notes: Timeout performed. Patient sedated. Relevant anatomy ID'd using [place]. Incremental 2-48m injection of LA with frequent aspiration. Patient tolerated procedure well.     Left Interscalene Block

## 2021-12-22 NOTE — Progress Notes (Signed)
Occupational Therapy Evaluation Patient Details Name: James Mack MRN: [id number] DOB: 1956/02/07 Today's Date: 12/22/2021   History of Present Illness patient is a 66 year old male s/p  L reverse shoulder arthroplasty   Clinical Impression   Mr. James Mack. Fidalgo is a 66 year old male that presents with above medical history.  Patient is without functional use of left upper extremity secondary to effects of surgery and interscalene block and shoulder precautions. Therapist provided education and instruction to patient and daughter in regards to exercises, precautions, positioning, donning upper extremity clothing and bathing while maintaining shoulder precautions, ice and edema management and donning/doffing sling. Patient and daughter verbalized understanding and demonstrated as needed. Patient needed assistance to donn shirt,socks, and shoes and provided with instruction on compensatory strategies to perform ADLs. Patient to follow up with MD for further therapy needs.     Recommendations for follow up therapy are one component of a multi-disciplinary discharge planning process, led by the attending physician.  Recommendations may be updated based on patient status, additional functional criteria and insurance authorization.   Follow Up Recommendations  No OT follow up    Assistance Recommended at Discharge PRN  Patient can return home with the following A little help with bathing/dressing/bathroom;Assistance with cooking/housework    Functional Status Assessment  Patient has not had a recent decline in their functional status  Equipment Recommendations  None recommended by OT    Recommendations for Other Services       Precautions / Restrictions         ADL either performed or assessed with clinical judgement   ADL                                         General ADL Comments: paitent needed min A donning shirt and sling. educated patient and daughter  on compensatory strategies.     Vision   Vision Assessment?: No apparent visual deficits     Perception     Praxis      Pertinent Vitals/Pain Pain Assessment Pain Assessment: No/denies pain (patient has block s/p L reverse shoulder arthroplasty.)     Hand Dominance Right   Extremity/Trunk Assessment Upper Extremity Assessment Upper Extremity Assessment: LUE deficits/detail;RUE deficits/detail RUE Deficits / Details: WNL for ADLs/IADLs LUE Deficits / Details: impaired sensation and motor control secondary to block LUE: Unable to fully assess due to immobilization       Cervical / Trunk Assessment Cervical / Trunk Assessment: Normal   Communication Communication Communication: No difficulties   Cognition Arousal/Alertness: Awake/alert Behavior During Therapy: WFL for tasks assessed/performed Overall Cognitive Status: Within Functional Limits for tasks assessed                                       General Comments       Exercises     Shoulder Instructions Shoulder Instructions Donning/doffing shirt without moving shoulder: Minimal assistance Method for sponge bathing under operated UE: Independent Donning/doffing sling/immobilizer: Minimal assistance Correct positioning of sling/immobilizer: Caregiver independent with task ROM for elbow, wrist and digits of operated UE: Caregiver independent with task Sling wearing schedule (on at all times/off for ADL's): Caregiver independent with task Proper positioning of operated UE when showering: Caregiver independent with task Dressing change: Caregiver independent with task Positioning of UE  while sleeping: Caregiver independent with task    Home Living Family/patient expects to be discharged to:: Private residence Living Arrangements: Alone Available Help at Discharge: Family Type of Home: House Home Access: Stairs to enter Technical brewer of Steps: 4 Entrance Stairs-Rails: None Home  Layout: One level     Bathroom Shower/Tub: Teacher, early years/pre: Handicapped height     [place]: Designer, fashion/clothing          Prior Functioning/Environment Prior Level of Function : Independent/Modified Independent                        OT Problem List:        OT Treatment/Interventions:      OT Goals(Current goals can be found in the care plan section) Acute Rehab OT Goals OT Goal Formulation: All assessment and education complete, DC therapy  OT Frequency:      Co-evaluation              AM-PAC OT "6 Clicks" Daily Activity     Outcome Measure Help from another person eating meals?: None Help from another person taking care of personal grooming?: A Little Help from another person toileting, which includes using toliet, bedpan, or urinal?: A Little Help from another person bathing (including washing, rinsing, drying)?: A Little Help from another person to put on and taking off regular upper body clothing?: A Little Help from another person to put on and taking off regular lower body clothing?: None 6 Click Score: 20   End of Session Nurse Communication: Mobility status; OT education complete   Activity Tolerance: Patient tolerated treatment well Patient left:  at edge of stretcher with nursing                    Time: 1105-1133 OT Time Calculation (min): 28 min Charges:  OT General Charges $OT Visit: 1 Visit OT Treatments $Self Care/Home Management : 8-22 mins  Charlann Lange, OTS Acute rehab services   Charlann Lange 12/22/2021, 12:03 PM

## 2021-12-23 ENCOUNTER — Encounter (HOSPITAL_COMMUNITY): Payer: Self-pay | Admitting: Orthopedic Surgery

## 2022-03-15 ENCOUNTER — Other Ambulatory Visit: Payer: Self-pay | Admitting: Internal Medicine

## 2022-06-13 NOTE — Progress Notes (Unsigned)
Cardiology Office Note:    Date:  06/14/2022   ID:  Doy Hutching, DOB 1955-09-26, MRN [id number]  PCP:  Waldemar Dickens, MD [place] Cardiologist: Elouise Munroe, MD   Reason for visit: Annual follow-up  History of Present Illness:    James Mack is a 67 y.o. male with a hx of OSA, HLD and mild CAD by CCTA.  He was last seen by Dr. Margaretann Loveless in November 2022.  He was doing well with lipids at goal and no recurrence of chest pain.  Today, he is doing well.  He is still working.  He has chronic pain with his arthritis.  He had shoulder surgery in September 2023.  He has a residual cough from COVID last month.  Despite this, he still exercises regularly.  He did elliptical this morning and went on a 4 mile walk earlier this week.  When the weather gets warmer he will return to bike riding.  With exercise, he denies chest pain and shortness of breath.  He also denies palpitations, lightheadedness, syncope and bleeding.  He has chronic below the knee swelling.  He takes Lasix 20 mg daily.  Edema is better in the morning after having his legs elevated.  He wears compression stockings on long trips but finds it uncomfortable to wear daily.  He uses CPAP regularly.     Past Medical History:  Diagnosis Date   Allergic rhinitis    Arthritis    hips, fingers   Cancer ([place])    skin cancer   Coronary artery disease    GERD (gastroesophageal reflux disease)    occasional   Headache(784.0)    ocular migraines   OSA (obstructive sleep apnea)    w/ insomnia NPSG 02/08/10-AHI 6.2 hr   PONV (postoperative nausea and vomiting)     Past Surgical History:  Procedure Laterality Date   HERNIA REPAIR Left 2002   RETINAL DETACHMENT SURGERY Right 04/10/2005   x 2   REVERSE SHOULDER ARTHROPLASTY Left 12/22/2021   Procedure: REVERSE SHOULDER ARTHROPLASTY;  Surgeon: Tania Ade, MD;  Location: [facility];  Service: Orthopedics;  Laterality: Left;   right hip replaced  04/11/1999    SKIN CANCER EXCISION  1995   nose   TONSILLECTOMY     as a child   TOTAL HIP ARTHROPLASTY Left 01/15/2013   Procedure: LEFT TOTAL HIP ARTHROPLASTY ANTERIOR APPROACH;  Surgeon: Gearlean Alf, MD;  Location: [facility];  Service: Orthopedics;  Laterality: Left;    Current Medications: Current Meds  Medication Sig   allopurinol (ZYLOPRIM) 300 MG tablet Take 300 mg by mouth daily.    aspirin EC 81 MG tablet Take 1 tablet (81 mg total) by mouth 2 (two) times daily. Swallow whole.   furosemide (LASIX) 20 MG tablet Take 20 mg by mouth daily.   Glucosamine-Chondroit-Vit C-Mn (GLUCOSAMINE-CHONDROITIN) TABS Take 1 tablet by mouth daily.   meloxicam (MOBIC) 7.5 MG tablet Take 7.5 mg by mouth in the morning and at bedtime.   Multiple Vitamin (MULTIVITAMIN) tablet Take 2 tablets by mouth daily.   NON FORMULARY Pt uses a cpap nightly   oxyCODONE-acetaminophen (PERCOCET) 5-325 MG tablet Take 1 tablet by mouth every 4 (four) hours as needed for severe pain.   polyvinyl alcohol (LIQUIFILM TEARS) 1.4 % ophthalmic solution Place 1 drop into both eyes as needed for dry eyes.   [DISCONTINUED] rosuvastatin (CRESTOR) 20 MG tablet Take 1 tablet (20 mg total) by mouth daily. Take 1 tablet (20  mg total) by mouth daily.Schedule office visit for future refills     Allergies:   Shellfish allergy, Soybean-containing drug products, and Sunflower oil   Social History   Socioeconomic History   Marital status: Divorced    Spouse name: Not on file   Number of children: Not on file   Years of education: Not on file   Highest education level: Not on file  Occupational History   Not on file  Tobacco Use   Smoking status: Never   Smokeless tobacco: Never  Vaping Use   Vaping Use: Never used  Substance and Sexual Activity   Alcohol use: No   Drug use: No   Sexual activity: Never  Other Topics Concern   Not on file  Social History Narrative   Not on file   Social Determinants of Health   Financial  Resource Strain: Not on file  Food Insecurity: Not on file  Transportation Needs: Not on file  Physical Activity: Not on file  Stress: Not on file  Social Connections: Not on file     Family History: The patient's family history includes Breast cancer in his mother; Cancer in his sister; Sleep apnea in his sister.  ROS:   Please see the history of present illness.     EKGs/Labs/Other Studies Reviewed:    EKG:  The ekg ordered today demonstrates normal sinus rhythm with heart rate 66.  Recent Labs: 12/09/2021: BUN 20; Creatinine, Ser 1.07; Hemoglobin 15.3; Platelets 173; Potassium 3.8; Sodium 142   Recent Lipid Panel Lab Results  Component Value Date/Time   CHOL 138 02/27/2020 08:31 AM   TRIG 125 02/27/2020 08:31 AM   HDL 50 02/27/2020 08:31 AM   LDLCALC 66 02/27/2020 08:31 AM    Physical Exam:    VS:  BP 128/74 (BP Location: Left Arm, Patient Position: Sitting, Cuff Size: Large)   Pulse 66   Ht '5\' 11"'$  (1.803 m)   Wt 275 lb 6.4 oz (124.9 kg)   SpO2 93%   BMI 38.41 kg/m    No data found.       Wt Readings from Last 3 Encounters:  06/14/22 275 lb 6.4 oz (124.9 kg)  12/22/21 264 lb (119.7 kg)  12/09/21 264 lb (119.7 kg)     GEN:  Well nourished, well developed in no acute distress HEENT: Normal NECK: No JVD; No carotid bruits CARDIAC: RRR, no murmurs, rubs, gallops RESPIRATORY:  Clear to auscultation without rales, wheezing or rhonchi  ABDOMEN: Soft, non-tender, non-distended MUSCULOSKELETAL: 1-2+ LE edema below shins bilaterally SKIN: Warm and dry NEUROLOGIC:  Alert and oriented PSYCHIATRIC:  Normal affect    ASSESSMENT AND PLAN   Coronary artery disease, no angina -Mild on CTA cors 2021  -Not diabetic, non-smoker -EKG today without ischemic changes -Continue statin and aspirin therapy.  Hypertension, controlled -mild LVH and mild ascending aortic dilation on echo 2021 -Continue Lasix 20 mg daily.  Creatinine and potassium normal in September  2023. -Goal BP is <130/80.  Recommend DASH diet (high in vegetables, fruits, low-fat dairy products, whole grains, poultry, fish, and nuts and low in sweets, sugar-sweetened beverages, and red meats), salt restriction and increase physical activity.  Bilateral LE edema -Continue Lasix 20 mg daily as above. -Elevate legs when able, increase mobility as tolerated, salt restriction.  Wear compression stockings as tolerated.  Hyperlipidemia with goal LDL <70 -Lipids followed by PCP  -Last LDL in our records was 66 in November 2021. -Continue Crestor 20 mg daily -refilled today. -  Discussed cholesterol lowering diets - Mediterranean diet, DASH diet, vegetarian diet, low-carbohydrate diet and avoidance of trans fats.  Discussed healthier choice substitutes.  Nuts, high-fiber foods, and fiber supplements may also improve lipids.    Obesity -Even a 5-10% weight loss can have cardiovascular benefits.   -Continue regular exercise.  Disposition - Follow-up in 1 year with Dr. Margaretann Loveless.   Medication Adjustments/Labs and Tests Ordered: Current medicines are reviewed at length with the patient today.  Concerns regarding medicines are outlined above.  Orders Placed This Encounter  Procedures   EKG 12-Lead   Meds ordered this encounter  Medications   rosuvastatin (CRESTOR) 20 MG tablet    Sig: Take 1 tablet (20 mg total) by mouth daily. Take 1 tablet (20 mg total) by mouth daily.Schedule office visit for future refills    Dispense:  90 tablet    Refill:  3    Patient Instructions  Medication Instructions:  No Changes *If you need a refill on your cardiac medications before your next appointment, please call your pharmacy*   Lab Work: No Labs If you have labs (blood work) drawn today and your tests are completely normal, you will receive your results only by: [place] (if you have MyChart) OR A paper copy in the mail If you have any lab test that is abnormal or we need to change your  treatment, we will call you to review the results.   Testing/Procedures: No Testing   Follow-Up: At [facility], you and your health needs are our priority.  As part of our continuing mission to provide you with exceptional heart care, we have created designated Provider Care Teams.  These Care Teams include your primary Cardiologist (physician) and Advanced Practice Providers (APPs -  Physician Assistants and Nurse Practitioners) who all work together to provide you with the care you need, when you need it.  We recommend signing up for the patient portal called "MyChart".  Sign up information is provided on this After Visit Summary.  MyChart is used to connect with patients for Virtual Visits (Telemedicine).  Patients are able to view lab/test results, encounter notes, upcoming appointments, etc.  Non-urgent messages can be sent to your provider as well.   To learn more about what you can do with MyChart, go to [email or web address].    Your next appointment:   1 year(s)  Provider:   Elouise Munroe, MD     Signed, Warren Lacy, PA-C  06/14/2022 8:37 AM    [place]

## 2022-06-14 ENCOUNTER — Ambulatory Visit: Payer: 59 | Attending: Physician Assistant | Admitting: Physician Assistant

## 2022-06-14 ENCOUNTER — Encounter: Payer: Self-pay | Admitting: Physician Assistant

## 2022-06-14 VITALS — BP 128/74 | HR 66 | Ht 71.0 in | Wt 275.4 lb

## 2022-06-14 DIAGNOSIS — E785 Hyperlipidemia, unspecified: Secondary | ICD-10-CM

## 2022-06-14 DIAGNOSIS — R600 Localized edema: Secondary | ICD-10-CM | POA: Diagnosis not present

## 2022-06-14 DIAGNOSIS — I10 Essential (primary) hypertension: Secondary | ICD-10-CM | POA: Diagnosis not present

## 2022-06-14 DIAGNOSIS — I2510 Atherosclerotic heart disease of native coronary artery without angina pectoris: Secondary | ICD-10-CM | POA: Diagnosis not present

## 2022-06-14 MED ORDER — ROSUVASTATIN CALCIUM 20 MG PO TABS: 20.0000 mg | ORAL_TABLET | Freq: Every day | ORAL | 3 refills | Status: DC

## 2022-06-14 NOTE — Patient Instructions (Signed)
Medication Instructions:  No Changes *If you need a refill on your cardiac medications before your next appointment, please call your pharmacy*   Lab Work: No Labs If you have labs (blood work) drawn today and your tests are completely normal, you will receive your results only by: [place] (if you have MyChart) OR A paper copy in the mail If you have any lab test that is abnormal or we need to change your treatment, we will call you to review the results.   Testing/Procedures: No Testing   Follow-Up: At [facility], you and your health needs are our priority.  As part of our continuing mission to provide you with exceptional heart care, we have created designated Provider Care Teams.  These Care Teams include your primary Cardiologist (physician) and Advanced Practice Providers (APPs -  Physician Assistants and Nurse Practitioners) who all work together to provide you with the care you need, when you need it.  We recommend signing up for the patient portal called "MyChart".  Sign up information is provided on this After Visit Summary.  MyChart is used to connect with patients for Virtual Visits (Telemedicine).  Patients are able to view lab/test results, encounter notes, upcoming appointments, etc.  Non-urgent messages can be sent to your provider as well.   To learn more about what you can do with MyChart, go to [email or web address].    Your next appointment:   1 year(s)  Provider:   Elouise Munroe, MD

## 2022-12-20 IMAGING — CT CT ABD-PELV W/ CM
2 of 5 series · 16 of 46 positions shown, 18 images · IV contrast (Omni 300)
Comparison: None.

CLINICAL DATA: Hernia, complicated

EXAM:
CT ABDOMEN AND PELVIS WITH CONTRAST
TECHNIQUE: Multidetector CT imaging of the abdomen and pelvis was performed
using the standard protocol following bolus administration of
intravenous contrast.
RADIATION DOSE REDUCTION: This exam was performed according to the
departmental dose-optimization program which includes automated
exposure control, adjustment of the mA and/or kV according to
patient size and/or use of iterative reconstruction technique.
CONTRAST:  100 mL Omnipaque 300

[Series 3: a/p w/ 5mm · axial · 0.95mm/px · z∈[+870,+1375]mm · 13 of 113 slices shown, 15 images]
[im 6/113  soft-tissue]
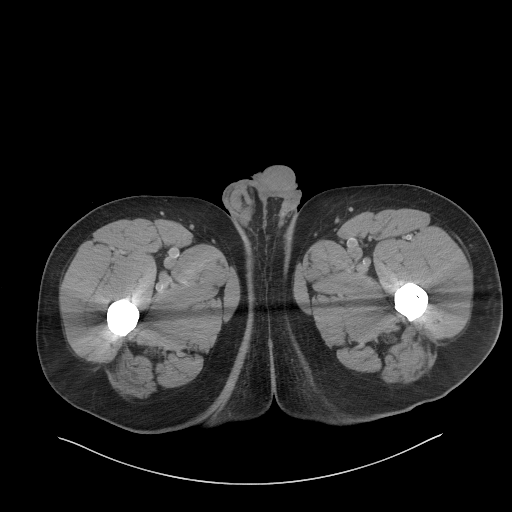
[im 6/113  bone]
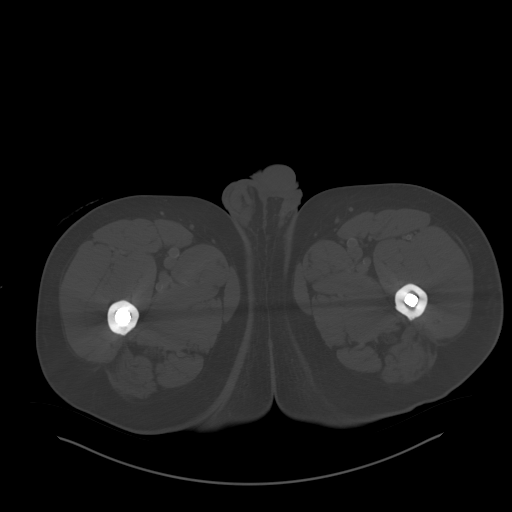
[im 17/113  soft-tissue]
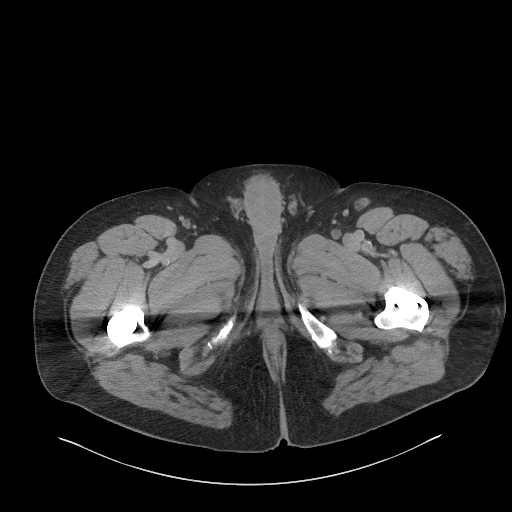
[im 23/113  soft-tissue]
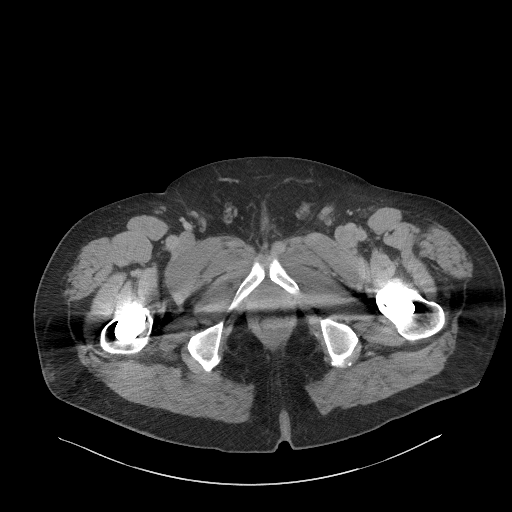
[im 34/113  soft-tissue]
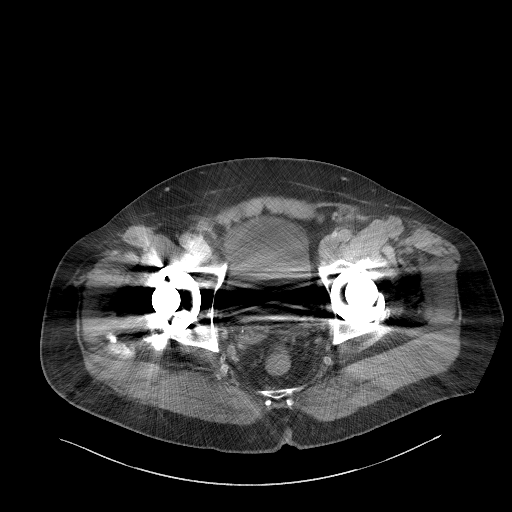
[im 40/113  soft-tissue]
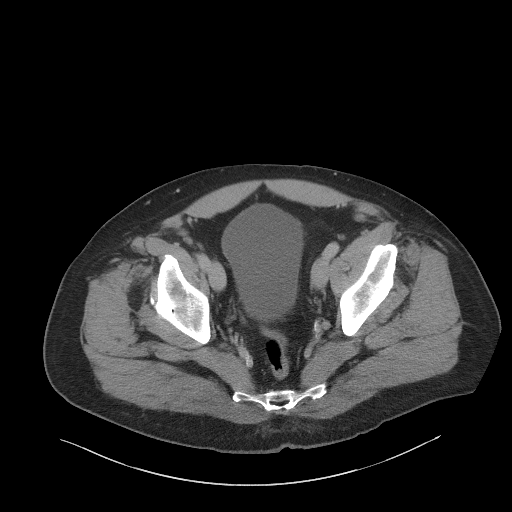
[im 51/113  soft-tissue]
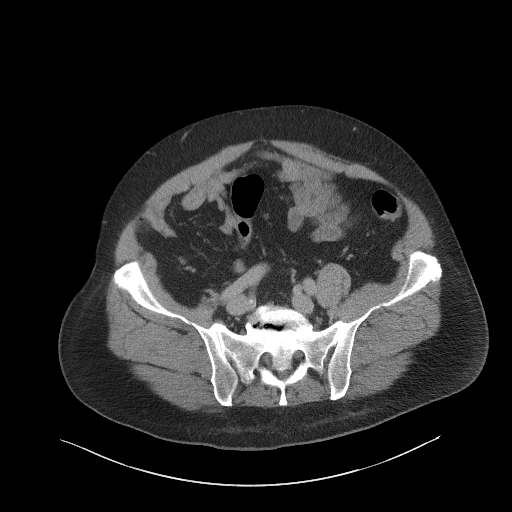
[im 57/113  soft-tissue]
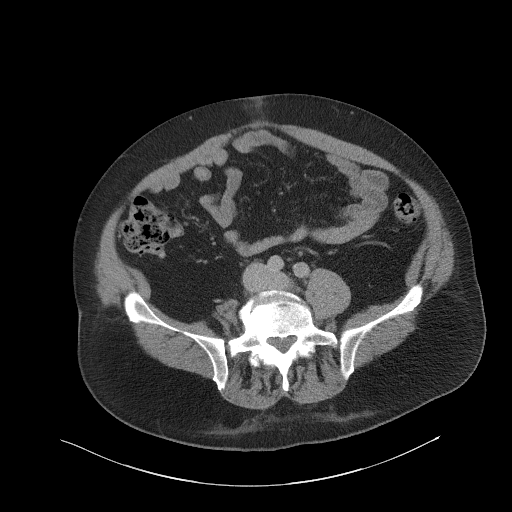
[im 62/113  soft-tissue]
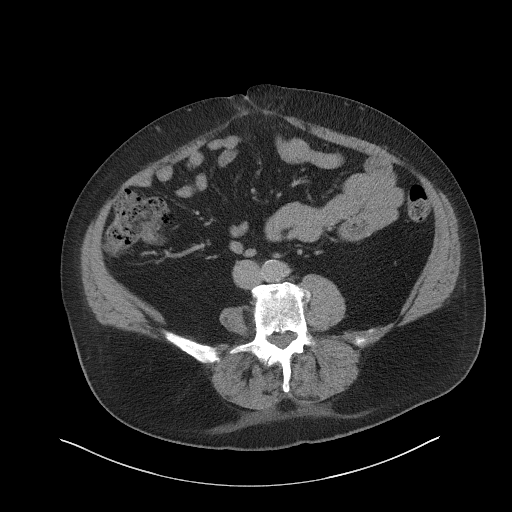
[im 73/113  soft-tissue]
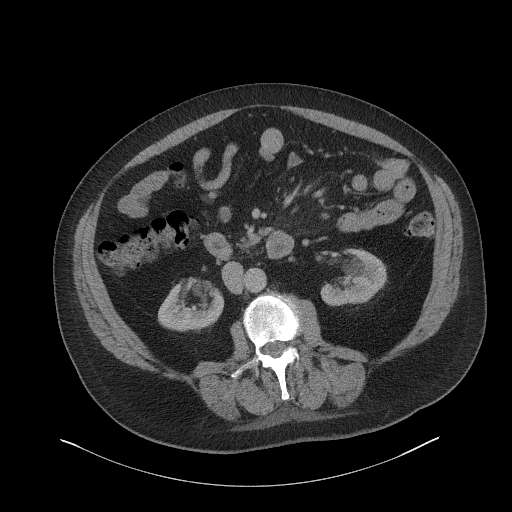
[im 73/113  bone]
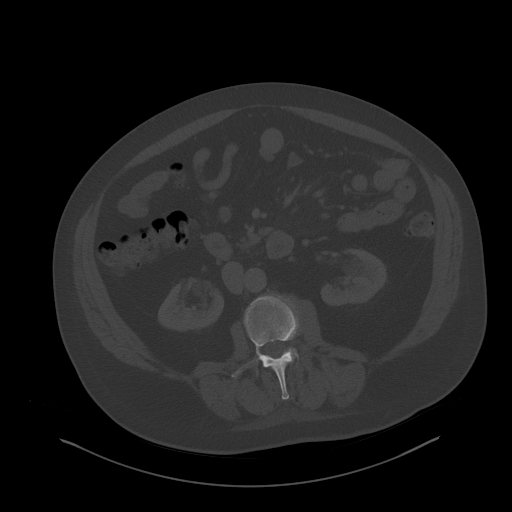
[im 79/113  soft-tissue]
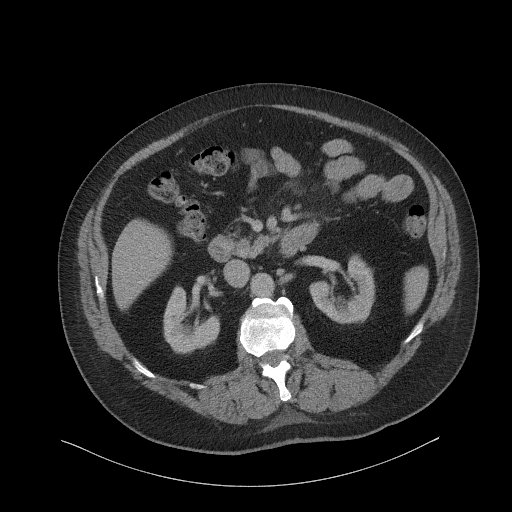
[im 90/113  soft-tissue]
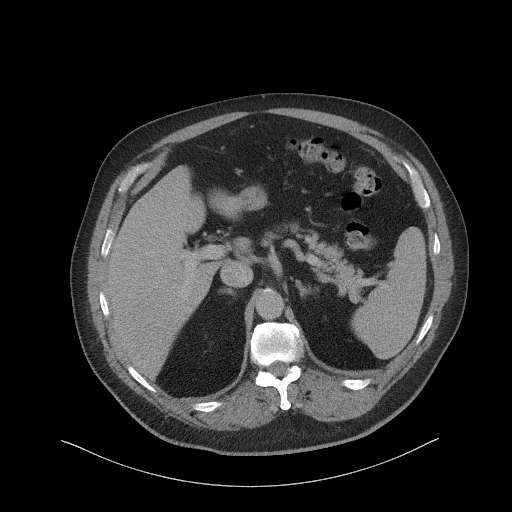
[im 96/113  soft-tissue]
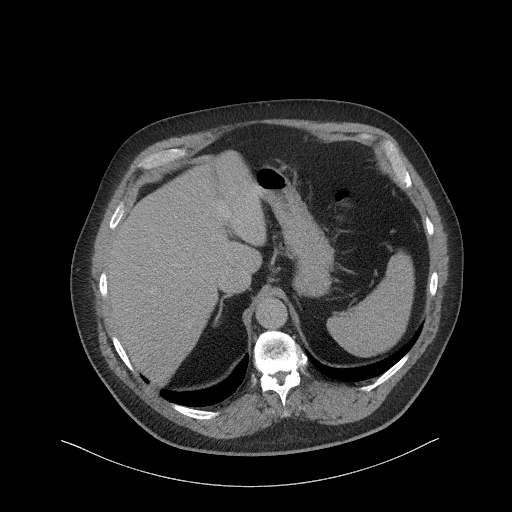
[im 107/113  soft-tissue]
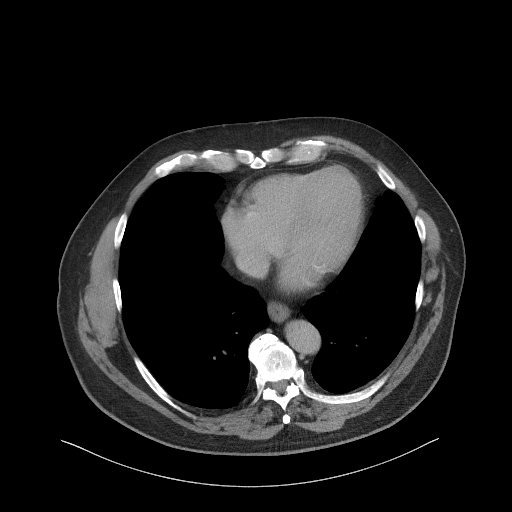

[Series 6: a/p w/ cor · coronal · 0.98mm/px · 3 of 196 slices shown]
[im 66/196  soft-tissue]
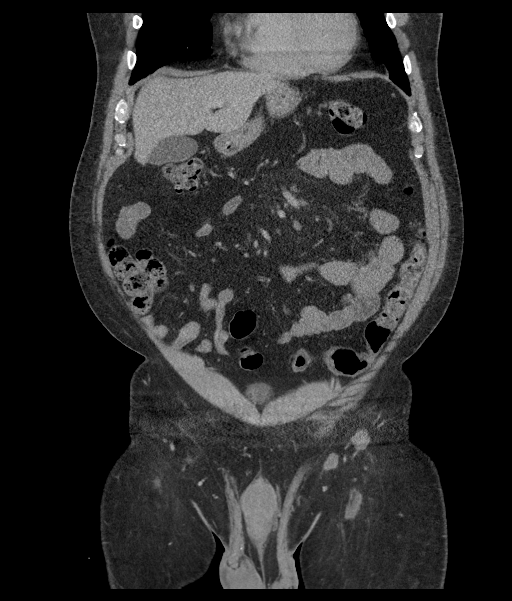
[im 87/196  soft-tissue]
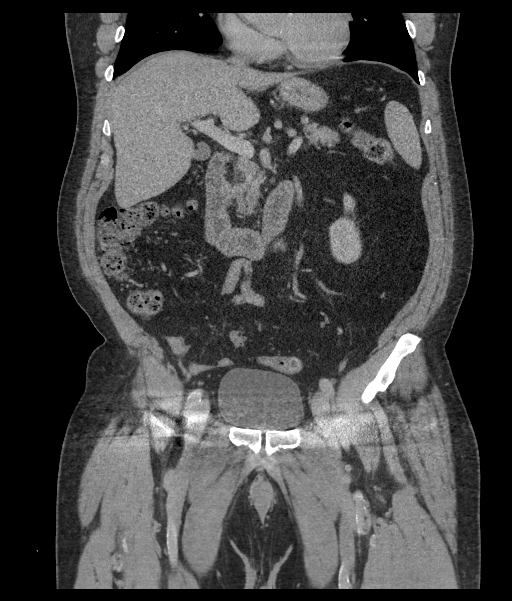
[im 109/196  soft-tissue]
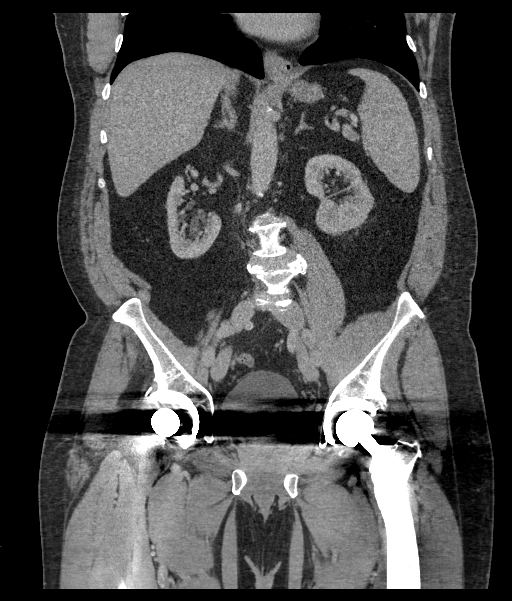

[16 of 46 positions shown; findings below may reference images not displayed]

FINDINGS: Lower chest: No acute abnormality.

Hepatobiliary: No focal liver abnormality is seen. No gallstones,
gallbladder wall thickening, or biliary dilatation.

Pancreas: Unremarkable.

Spleen: Unremarkable.

Adrenals/Urinary Tract: Adrenals are unremarkable. 2 mm
nonobstructing calculi at the interpolar region and lower pole of
the right kidney. Bladder is unremarkable though partially obscured
by artifact.

Stomach/Bowel: Stomach is within normal limits. Bowel is normal in
caliber. Normal appendix.

Vascular/Lymphatic: Minor calcified plaque.  No enlarged nodes.

Reproductive: Obscured by artifact.

Other: No free fluid. There is a wide neck fat containing umbilical
hernia.

Musculoskeletal: Bilateral hip arthroplasties with associated streak
artifact. Degenerative changes of the included spine with multilevel
canal and foraminal stenosis.
IMPRESSION: Wide neck, fat containing umbilical hernia.

2 small nonobstructing calculi of the right kidney.

## 2023-01-26 ENCOUNTER — Ambulatory Visit: Payer: 59 | Admitting: Primary Care

## 2023-02-16 ENCOUNTER — Encounter: Payer: Self-pay | Admitting: Primary Care

## 2023-02-16 ENCOUNTER — Ambulatory Visit: Payer: 59 | Admitting: Primary Care

## 2023-02-16 VITALS — BP 111/58 | HR 63 | Temp 98.4°F | Ht 72.0 in | Wt 256.0 lb

## 2023-02-16 DIAGNOSIS — Z23 Encounter for immunization: Secondary | ICD-10-CM

## 2023-02-16 DIAGNOSIS — G4733 Obstructive sleep apnea (adult) (pediatric): Secondary | ICD-10-CM | POA: Diagnosis not present

## 2023-02-16 DIAGNOSIS — Z0000 Encounter for general adult medical examination without abnormal findings: Secondary | ICD-10-CM

## 2023-02-16 NOTE — Patient Instructions (Addendum)
Download shows excellent compliance Sleep apnea is well-controlled on current pressure settings Continue to wear CAPp nightly 4-6 hours or longer  We will establish you with a new DME company, likely [employer] You received pneumonia vaccine (prevnar 20) today  Orders: Establish with [employer] for CPAP supplies (previous with [facility])  Follow-up: 1 year with Beth NP or sooner if needed   Pneumococcal Conjugate Vaccine (PCV20) Injection What is this medication? PNEUMOCOCCAL CONJUGATE VACCINE (NEU mo KOK al kon ju gate vak SEEN) reduces the risk of pneumococcal disease, such as pneumonia. It does not treat pneumococcal disease. It is still possible to get pneumococcal disease after receiving this vaccine, but the symptoms may be less severe or not last as long. It works by helping your immune system learn how to fight off a future infection. This medicine may be used for other purposes; ask your health care provider or pharmacist if you have questions. COMMON BRAND NAME(S): Prevnar 20 What should I tell my care team before I take this medication? They need to know if you have any of these conditions: Bleeding disorder Fever Immune system problems An unusual or allergic reaction to pneumococcal vaccine, diphtheria toxoid, other vaccines, other medications, foods, dyes, or preservatives Pregnant or trying to get pregnant Breastfeeding How should I use this medication? This vaccine is injected into a muscle. It is given by your care team. A copy of Vaccine Information Statements will be given before each vaccination. Be sure to read this information carefully each time. This sheet may change often. Talk to your care team about the use of this medication in children. While it may be given to children as young as 6 weeks for selected conditions, precautions do apply. Overdosage: If you think you have taken too much of this medicine contact a poison control center or emergency room at  once. NOTE: This medicine is only for you. Do not share this medicine with others. What if I miss a dose? This does not apply. This medication is not for regular use. What may interact with this medication? Medications for cancer chemotherapy Medications that suppress your immune function Steroid medications, such as prednisone or cortisone This list may not describe all possible interactions. Give your health care provider a list of all the medicines, herbs, non-prescription drugs, or dietary supplements you use. Also tell them if you smoke, drink alcohol, or use illegal drugs. Some items may interact with your medicine. What should I watch for while using this medication? Visit your care team regularly. Report any side effects to your care team right away. This vaccine, like all vaccines, may not fully protect everyone. What side effects may I notice from receiving this medication? Side effects that you should report to your care team as soon as possible: Allergic reactions--skin rash, itching, hives, swelling of the face, lips, tongue, or throat Side effects that usually do not require medical attention (report these to your care team if they continue or are bothersome): Fatigue Fever Headache Joint pain Muscle pain Pain, redness, or irritation at injection site This list may not describe all possible side effects. Call your doctor for medical advice about side effects. You may report side effects to FDA at 1-800-FDA-1088. Where should I keep my medication? This vaccine is only given by your care team. It will not be stored at home. NOTE: This sheet is a summary. It may not cover all possible information. If you have questions about this medicine, talk to your doctor, pharmacist, or health  care provider.  2024 Elsevier/Gold Standard (2021-09-07 00:00:00)

## 2023-02-16 NOTE — Progress Notes (Signed)
@Patient  ID: James Mack, male    DOB: December 24, 1955, 67 y.o.   MRN: [id number]  Chief Complaint  Patient presents with   Follow-up    Referring provider: Ozella Rocks, MD  HPI: 67 year old male, never smoked. PMH significant for OSA, asthma, season allergies. Patient of Dr. Maple Hudson, last seen 10/03/19 by pulmonary NP. NPSG 12/22/09, mild OSA/ AHI 6.2/hr.   Previous [facility] pulmonary encounter:  05/30/2021 Patient presents today for annual follow-up OSA. He is doing well. No acute complaints. He is compliant with CPAP use. Sleeping well at night. He has noticed improvement in daytime sleepiness since starting CPAP. He is unable to bring CPAP machine when occasionally traveling for work. DME company [facility]. [place] 5/24.   Airview download 06/27/20-07/26/20 Usage 30/30 days (100%), 27 days (90%) Average usage days used 5 hours 2 mins Pressure 9cm h20 Airleaks 10.2/min AHI 0.6/hr   Obstructive sleep apnea - Stable; Patient is 90% compliant with CPAP > 4 hours. Pressure 9cm h20, residual AHI 0.6/hr. No changes today. Encourage continued compliance wit CPAP 4-6 hours every night and weight loss efforts. Advised against driving if experience daytime sleepiness. DME company is [facility], he does not need supplies renewed at this time. Follow-up in 1 year or sooner if needed.    02/16/2023- Interim hx  Discussed the use of AI scribe software for clinical note transcription with the patient, who gave verbal consent to proceed.  History of Present Illness   Patient presents today for follow-up CPAP compliance. He has been compliant with CPAP therapy, using it every night for an average of five hours and two minutes. Current pressure is set at 9cm h20 with a residual AHI 0.6/hour, indicating no significant breakthrough apneic events during sleep.  The patient has been experiencing issues with their CPAP mask and headgear. The Velcro on the headgear is not holding well,  necessitating a replacement. The patient uses a nasal mask. He needs referral to local DME company for CPAP supplies.  The patient reports satisfactory sleep quality, although they do not meet the seven-hour sleep duration recommended by their workplace health program. They feel rested and do not experience daytime fatigue. The patient has no issues with the CPAP machine, aside from the headgear.  The patient does not consume alcohol and is working on weight loss. He has regular check-ups with their primary care provider.    Allergies  Allergen Reactions   Shellfish Allergy Nausea And Vomiting   Soybean-Containing Drug Products Diarrhea   Sunflower Oil Swelling    Immunization History  Administered Date(s) Administered   Fluad Trivalent(High Dose 65+) 01/16/2023   Influenza Split 12/10/2011, 01/08/2013   Influenza,inj,quad, With Preservative 12/09/2016   Influenza-Unspecified 12/09/2017, 12/31/2018   PFIZER(Purple Top)SARS-COV-2 Vaccination 06/21/2019, 07/12/2019   Pfizer(Comirnaty)Fall Seasonal Vaccine 12 years and older 02/02/2023   Tdap 11/11/2007    Past Medical History:  Diagnosis Date   Allergic rhinitis    Arthritis    hips, fingers   Cancer ([facility])    skin cancer   Coronary artery disease    GERD (gastroesophageal reflux disease)    occasional   Headache(784.0)    ocular migraines   OSA (obstructive sleep apnea)    w/ insomnia NPSG 02/08/10-AHI 6.2 hr   PONV (postoperative nausea and vomiting)     Tobacco History: Social History   Tobacco Use  Smoking Status Never  Smokeless Tobacco Never   Counseling given: Not Answered   Outpatient Medications Prior to Visit  Medication Sig Dispense Refill   allopurinol (ZYLOPRIM) 300 MG tablet Take 300 mg by mouth daily.      aspirin EC 81 MG tablet Take 1 tablet (81 mg total) by mouth 2 (two) times daily. Swallow whole. 30 tablet 11   furosemide (LASIX) 20 MG tablet Take 20 mg by mouth daily.      Glucosamine-Chondroit-Vit C-Mn (GLUCOSAMINE-CHONDROITIN) TABS Take 1 tablet by mouth daily.     LOSARTAN POTASSIUM-HCTZ PO Take 10 mg by mouth in the morning, at noon, in the evening, and at bedtime.     meloxicam (MOBIC) 7.5 MG tablet Take 7.5 mg by mouth in the morning and at bedtime.  3   Multiple Vitamin (MULTIVITAMIN) tablet Take 2 tablets by mouth daily.     NON FORMULARY Pt uses a cpap nightly     polyvinyl alcohol (LIQUIFILM TEARS) 1.4 % ophthalmic solution Place 1 drop into both eyes as needed for dry eyes.     rosuvastatin (CRESTOR) 20 MG tablet Take 1 tablet (20 mg total) by mouth daily. Take 1 tablet (20 mg total) by mouth daily.Schedule office visit for future refills 90 tablet 3   tamsulosin (FLOMAX) 0.4 MG CAPS capsule Take 0.4 mg by mouth daily.     No facility-administered medications prior to visit.   Review of Systems  Review of Systems  Constitutional: Negative.  Negative for fatigue.  HENT: Negative.    Respiratory: Negative.    Cardiovascular: Negative.   Psychiatric/Behavioral:  Negative for sleep disturbance.    Physical Exam  BP (!) 111/58 (BP Location: Right Arm, Patient Position: Sitting, Cuff Size: Normal)   Pulse 63   Temp 98.4 F (36.9 C) (Oral)   Ht 6' (1.829 m)   Wt 256 lb (116.1 kg)   SpO2 96%   BMI 34.72 kg/m  Physical Exam Constitutional:      Appearance: Normal appearance.  HENT:     Head: Normocephalic and atraumatic.     Mouth/Throat:     Mouth: Mucous membranes are moist.     Pharynx: Oropharynx is clear.  Cardiovascular:     Rate and Rhythm: Normal rate and regular rhythm.  Pulmonary:     Effort: Pulmonary effort is normal.     Breath sounds: Normal breath sounds.  Skin:    General: Skin is warm and dry.  Neurological:     General: No focal deficit present.     Mental Status: He is alert and oriented to person, place, and time. Mental status is at baseline.  Psychiatric:        Mood and Affect: Mood normal.        Behavior:  Behavior normal.        Thought Content: Thought content normal.        Judgment: Judgment normal.      Lab Results:  CBC    Component Value Date/Time   WBC 5.2 12/09/2021 0834   RBC 4.86 12/09/2021 0834   HGB 15.3 12/09/2021 0834   HCT 44.1 12/09/2021 0834   PLT 173 12/09/2021 0834   MCV 90.7 12/09/2021 0834   MCH 31.5 12/09/2021 0834   MCHC 34.7 12/09/2021 0834   RDW 12.3 12/09/2021 0834    BMET    Component Value Date/Time   NA 142 12/09/2021 0834   NA 141 02/27/2020 0831   K 3.8 12/09/2021 0834   CL 107 12/09/2021 0834   CO2 29 12/09/2021 0834   GLUCOSE 95 12/09/2021 0834   BUN 20 12/09/2021  0834   BUN 15 02/27/2020 0831   CREATININE 1.07 12/09/2021 0834   CALCIUM 9.2 12/09/2021 0834   GFRNONAA >60 12/09/2021 0834   GFRAA 83 02/27/2020 0831    BNP No results found for: "BNP"  ProBNP No results found for: "PROBNP"  Imaging: No results found.   Assessment & Plan:   1. Obstructive sleep apnea  2. Healthcare maintenance  Obstructive Sleep Apnea Patient is 100% compliant with CPAP use over the last 30 days, average of 5 hours and 2 minutes per night. Pressure set at 9 cm H20. Apnea-Hypopnea Index (AHI) score per hour is 0.6, indicating effective treatment. Patient reports no issues with the machine but needs new headgear due to worn-out Velcro. -Continue CPAP use nightly 4-6 hours -Refer to [facility] for CPAP supplies. -Replace headgear and consider replacing nasal mask. -Follow up in one year or sooner if issues arise.     Health maintenance -Received pneumonia vaccine (prevnar 20)   Glenford Bayley, NP 02/16/2023

## 2023-02-22 NOTE — Addendum Note (Signed)
Addended by: Glenford Bayley on: 02/22/2023 12:13 PM   Modules accepted: Orders

## 2023-05-20 ENCOUNTER — Other Ambulatory Visit: Payer: Self-pay | Admitting: Physician Assistant

## 2023-07-18 ENCOUNTER — Ambulatory Visit: Payer: 59 | Attending: Internal Medicine | Admitting: Internal Medicine

## 2023-07-18 ENCOUNTER — Encounter: Payer: Self-pay | Admitting: Internal Medicine

## 2023-07-18 VITALS — BP 122/64 | HR 46 | Ht 72.0 in | Wt 220.0 lb

## 2023-07-18 DIAGNOSIS — I2510 Atherosclerotic heart disease of native coronary artery without angina pectoris: Secondary | ICD-10-CM

## 2023-07-18 DIAGNOSIS — R600 Localized edema: Secondary | ICD-10-CM

## 2023-07-18 DIAGNOSIS — G4733 Obstructive sleep apnea (adult) (pediatric): Secondary | ICD-10-CM | POA: Diagnosis not present

## 2023-07-18 DIAGNOSIS — I359 Nonrheumatic aortic valve disorder, unspecified: Secondary | ICD-10-CM

## 2023-07-18 DIAGNOSIS — I10 Essential (primary) hypertension: Secondary | ICD-10-CM | POA: Diagnosis not present

## 2023-07-18 DIAGNOSIS — E785 Hyperlipidemia, unspecified: Secondary | ICD-10-CM

## 2023-07-18 DIAGNOSIS — R001 Bradycardia, unspecified: Secondary | ICD-10-CM

## 2023-07-18 NOTE — Patient Instructions (Signed)
 Medication Instructions:  No Changes  Lab Work: None  Follow-Up: At [facility], you and your health needs are our priority.  As part of our continuing mission to provide you with exceptional heart care, our providers are all part of one team.  This team includes your primary Cardiologist (physician) and Advanced Practice Providers or APPs (Physician Assistants and Nurse Practitioners) who all work together to provide you with the care you need, when you need it.  Your next appointment:   1 year(s) (Please call in Feb 2026 for a April 2026 yearly follow up appointment)  Provider:   Parke Poisson, MD    Other Instructions Please call us or send a MyChart message with any Cardiology related questions/concerns.  [phone].  Thank you!         Valet parking services will be available as well.

## 2023-07-18 NOTE — Progress Notes (Signed)
 " Cardiology Office Note:  .   Date:  07/18/2023  ID:  James Mack, DOB 05/26/1955, MRN [id number] PCP: James Alm PARAS, MD  [facility] Providers Cardiologist:  James DELENA Merck, MD    History of Present Illness: .   James Mack is a 67 y.o. male.  Discussed the use of AI scribe software for clinical note transcription with the patient, who gave verbal consent to proceed.  History of Present Illness A 68 year old patient with a history of OSA, hyperlipidemia, and mild CAD presents for a routine follow-up. The patient has been on Crestor  20mg  daily and has been using a CPAP for OSA. The patient has also been taking aspirin  81mg  daily. The patient reports significant weight loss since September, going from 278 pounds to 212 pounds. The patient has been experiencing some swelling in the feet and ankles, which has been managed with furosemide. The patient reports that the swelling increases when the furosemide is not taken. The patient's heart rate is noted to be slow, which is not unusual for the patient. The patient does not report any symptoms of lightheadedness or dizziness unless sleep-deprived and fasting.    ROS: negative except per HPI above.  Studies Reviewed: SABRA   EKG Interpretation Date/Time:  Wednesday July 18 2023 15:40:52 EDT Ventricular Rate:  46 PR Interval:  194 QRS Duration:  74 QT Interval:  456 QTC Calculation: 399 R Axis:   -4  Text Interpretation: Sinus bradycardia When compared with ECG of 03-Jan-2013 14:14, No significant change was found Confirmed by Mack James ([id number]) on 07/18/2023 3:57:07 PM    Results LABS LDL: 66 (02/2021) Sodium: Normal (07/2022) Potassium: Normal (07/2022) Kidney function: Normal (07/2022) Liver function tests: Normal (07/2022)  RADIOLOGY Ascending aorta measurement: 41 mm Aortic valve calcification: Mild Aortic valve calcium  score: 322 Risk Assessment/Calculations:          Physical Exam:   VS:  BP  122/64 (BP Location: Left Arm, Patient Position: Sitting, Cuff Size: Normal)   Pulse (!) 46   Ht 6' (1.829 m)   Wt 220 lb (99.8 kg)   SpO2 98%   BMI 29.84 kg/m    Wt Readings from Last 3 Encounters:  07/18/23 220 lb (99.8 kg)  02/16/23 256 lb (116.1 kg)  06/14/22 275 lb 6.4 oz (124.9 kg)     Physical Exam VITALS: P- 46, BP- 122/64 GENERAL: Alert, cooperative, well developed, no acute distress. HEENT: Normocephalic, normal oropharynx, moist mucous membranes. CHEST: Clear to auscultation bilaterally, no wheezes, rhonchi, or crackles. CARDIOVASCULAR: Normal heart rate and rhythm, S1 and S2 normal without murmurs. ABDOMEN: Soft, non-tender, non-distended, without organomegaly, normal bowel sounds. EXTREMITIES: No cyanosis or edema. NEUROLOGICAL: Cranial nerves grossly intact, moves all extremities without gross motor or sensory deficit.   ASSESSMENT AND PLAN: .    Assessment and Plan Assessment & Plan Coronary Artery Disease (CAD) Mild CAD confirmed by coronary CTA. Asymptomatic, on rosuvastatin  and aspirin . - Continue rosuvastatin  20 mg daily. - Continue aspirin  81 mg daily. - Repeat echocardiogram this year.  Bradycardia Heart rate at 46 bpm, higher than usual. No significant symptoms. Advised on monitoring and risks of severe bradycardia. - Monitor heart rate and symptoms. - Seek medical attention if heart rate drops or if symptomatic.  Aortic Valve Calcification Mild calcifications with calcium  score of 322. Asymptomatic. - Repeat echocardiogram this year.  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 Monitoring recommended. - Repeat echocardiogram this year.  Hypertension Blood pressure  controlled at 122/64 mmHg on losartan. Discussed potential discontinuation if hypotension occurs. - Monitor blood pressure at home. - Consider stopping losartan if symptomatic hypotension occurs.  Hyperlipidemia Managed with rosuvastatin . Recent LDL at 66. Advised to  update lipid panel. - Continue rosuvastatin  20 mg daily. - Obtain updated lipid panel at next physical and send results to cardiologist.  Peripheral Edema Intermittent ankle swelling due to venous insufficiency. Managed with furosemide and compression socks. - Continue furosemide 20 mg daily as needed for swelling. - Consider compression socks for non-medication therapy. - Monitor kidney function with lab work.  Obstructive Sleep Apnea (OSA) OSA managed with CPAP therapy. No new symptoms.  Follow-up Follow-up plans discussed for bradycardia and cardiovascular concerns. - Schedule follow-up appointment in one year      James Merck, MD, [facility] "

## 2023-08-20 ENCOUNTER — Other Ambulatory Visit: Payer: Self-pay | Admitting: *Deleted

## 2023-08-20 MED ORDER — ROSUVASTATIN CALCIUM 20 MG PO TABS: 20.0000 mg | ORAL_TABLET | Freq: Every day | ORAL | 3 refills | Status: AC

## 2023-10-11 LAB — LAB REPORT - SCANNED
EGFR: 75
PSA, FREE: 0.7

## 2023-10-24 ENCOUNTER — Ambulatory Visit: Payer: Self-pay | Admitting: Internal Medicine
